# Patient Record
Sex: Female | Born: 2000 | Race: White | Hispanic: Yes | Marital: Single | Smoking: Former smoker
Health system: Southern US, Community
[De-identification: ages and names within clinical notes are randomized; demographics above are authoritative.]

## PROBLEM LIST (undated history)

## (undated) ENCOUNTER — Inpatient Hospital Stay: Payer: Self-pay

## (undated) DIAGNOSIS — R51 Headache: Secondary | ICD-10-CM

## (undated) DIAGNOSIS — D649 Anemia, unspecified: Secondary | ICD-10-CM

## (undated) DIAGNOSIS — G43009 Migraine without aura, not intractable, without status migrainosus: Secondary | ICD-10-CM

## (undated) HISTORY — DX: Headache: R51

## (undated) HISTORY — DX: Anemia, unspecified: D64.9

## (undated) HISTORY — PX: NO PAST SURGERIES: SHX2092

## (undated) HISTORY — DX: Migraine without aura, not intractable, without status migrainosus: G43.009

---
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
## 2018-08-19 ENCOUNTER — Encounter: Payer: Self-pay | Admitting: Obstetrics and Gynecology

## 2018-08-19 NOTE — Progress Notes (Signed)
[place], [employer] Complaint  Patient presents with  . New Patient (Initial Visit)    would like to start Birth control history of abnormal cycle     HPI:      [name] is a 18 y.o. No obstetric history on file. who LMP was Patient's last menstrual period was 08/13/2018., presents today for NP [facility] consult. She is interested in either IUD or nexplanon. Menses are Q1-3 months, lasting 3-7 days, occas BTB, mild dysmen occas. Menses have been this way since menarche age 33. She is sex active, using condoms sometimes. Currently on period.  Has a hx of migraines without aura but does have some vision changes druing headaches. No hx of HTN, DVTs. No Tob/alcholo/drug use.  No hx of STDs.   Past Medical History:  Diagnosis Date  . Headache(784.0)   . Migraine without aura     No past surgical history on file.  Family History  Problem Relation Age of Onset  . Diabetes Paternal Grandfather        Died at the age of 25  . Migraines Mother   . Migraines Other     Social History   Socioeconomic History  . Marital status: Single    Spouse name: Not on file  . Number of children: Not on file  . Years of education: Not on file  . Highest education level: Not on file  Occupational History  . Not on file  Social Needs  . Financial resource strain: Not on file  . Food insecurity    Worry: Not on file    Inability: Not on file  . Transportation needs    Medical: Not on file    Non-medical: Not on file  Tobacco Use  . Smoking status: Never Smoker  . Smokeless tobacco: Never Used  Substance and Sexual Activity  . Alcohol use: No  . Drug use: No  . Sexual activity: Never  Lifestyle  . Physical activity    Days per week: Not on file    Minutes per session: Not on file  . Stress: Not on file  Relationships  . Social Herbalist on phone: Not on file    Gets together: Not on file    Attends religious service: Not on file    Active  member of club or organization: Not on file    Attends meetings of clubs or organizations: Not on file    Relationship status: Not on file  . Intimate partner violence    Fear of current or ex partner: Not on file    Emotionally abused: Not on file    Physically abused: Not on file    Forced sexual activity: Not on file  Other Topics Concern  . Not on file  Social History Narrative   [name] is a 11th grade student.   She attends [employer].   She lives with both parents and her little brother.   She enjoys eating, soccer, and volleyball.    Outpatient Medications Prior to Visit  Medication Sig Dispense Refill  . ondansetron (ZOFRAN ODT) 4 MG disintegrating tablet Take 1 tablet (4 mg total) by mouth every 8 (eight) hours as needed for nausea or vomiting. 10 tablet 0  . sulfamethoxazole-trimethoprim (BACTRIM DS,SEPTRA DS) 800-160 MG tablet Take 1 tablet by mouth 2 (two) times daily. 10 tablet 0  . topiramate (TOPAMAX) 25 MG tablet Take one tablet at nighttime for one  week then 2 tablets at nighttime 62 tablet 5   No facility-administered medications prior to visit.       ROS:  Review of Systems  Constitutional: Negative for fatigue, fever and unexpected weight change.  Respiratory: Negative for cough, shortness of breath and wheezing.   Cardiovascular: Negative for chest pain, palpitations and leg swelling.  Gastrointestinal: Negative for blood in stool, constipation, diarrhea, nausea and vomiting.  Endocrine: Negative for cold intolerance, heat intolerance and polyuria.  Genitourinary: Negative for dyspareunia, dysuria, flank pain, frequency, genital sores, hematuria, menstrual problem, pelvic pain, urgency, vaginal bleeding, vaginal discharge and vaginal pain.  Musculoskeletal: Negative for back pain, joint swelling and myalgias.  Skin: Negative for rash.  Neurological: Positive for headaches. Negative for dizziness, syncope, light-headedness and numbness.   Hematological: Negative for adenopathy.  Psychiatric/Behavioral: Negative for agitation, confusion, sleep disturbance and suicidal ideas. The patient is not nervous/anxious.   BREAST: No symptoms   OBJECTIVE:   Vitals:  BP 110/62   Pulse 88   Ht 5\' 3"  (1.6 m)   Wt 141 lb 12.8 oz (64.3 kg)   LMP 08/13/2018   SpO2 99%   BMI 25.12 kg/m   Physical Exam Vitals signs reviewed.  Constitutional:      Appearance: She is well-developed.  Neck:     Musculoskeletal: Normal range of motion.  Pulmonary:     Effort: Pulmonary effort is normal.  Genitourinary:    General: Normal vulva.     Pubic Area: No rash.      Labia:        Right: No rash, tenderness or lesion.        Left: No rash, tenderness or lesion.      Vagina: Normal. No vaginal discharge, erythema or tenderness.     Cervix: Normal.  Musculoskeletal: Normal range of motion.  Skin:    General: Skin is warm and dry.  Neurological:     General: No focal deficit present.     Mental Status: She is alert and oriented to person, place, and time.  Psychiatric:        Mood and Affect: Mood normal.        Behavior: Behavior normal.        Thought Content: Thought content normal.        Judgment: Judgment normal.    IUD Insertion Procedure Note Patient identified, informed consent performed, consent signed.   Discussed risks of irregular bleeding, cramping, infection, malpositioning or misplacement of the IUD outside the uterus which may require further procedure such as laparoscopy, risk of failure <1%. Time out was performed.   Speculum placed in the vagina.  Cervix visualized.  Cleaned with Betadine x 2.  Grasped anteriorly with a single tooth tenaculum.  Uterus sounded to 8.0 cm.   IUD placed per manufacturer's recommendations.  Strings trimmed to 3 cm. Tenaculum was removed, good hemostasis noted.  Patient tolerated procedure well.   Assessment/Plan: Encounter for insertion of intrauterine contraceptive device (IUD) - Plan:  Levonorgestrel (KYLEENA) 19.5 MG IUD, BC options discussed. Pt decided on IUD. Kyleena inserted today. Pt did well. RTO in 4 wks for f/u. Condoms.  Screening for STD (sexually transmitted disease) - Plan: Cervicovaginal ancillary only,    Meds ordered this encounter  Medications  . Levonorgestrel (KYLEENA) 19.5 MG IUD    Sig: 1 each (19.5 mg total) by Intrauterine route once for 1 dose.    Dispense:  1 Intra Uterine Device    Refill:  0  Order Specific Question:   Supervising Provider    Answer:   Nadara MustardHARRIS, ROBERT P [[id number]]   Patient was given post-procedure instructions.  She was advised to have backup contraception for one week.   Call if you are having increasing pain, cramps or bleeding or if you have a fever greater than 100.4 degrees F., shaking chills, nausea or vomiting. Patient was also asked to check IUD strings periodically and follow up in 4 weeks for IUD check.   Return in about 4 weeks (around 09/17/2018) for IUD f/u.  Atiana Levier B. Kaevion Sinclair, PA-C 08/20/2018 11:10 AM

## 2018-08-19 NOTE — Patient Instructions (Addendum)
I value your feedback and entrusting us with your care. If you get a [facility] patient survey, I would appreciate you taking the time to let us know about your experience today. Thank you!  [facility] [phone]  Instructions after IUD insertion  Most women experience no significant problems after insertion of an IUD, however minor cramping and spotting for a few days is common. Cramps may be treated with ibuprofen 800mg every 8 hours or Tylenol 650 mg every 4 hours. Contact [facility] immediately if you experience any of the following symptoms during the next week: temperature >99.6 degrees, worsening pelvic pain, abdominal pain, fainting, unusually heavy vaginal bleeding, foul vaginal discharge, or if you think you have expelled the IUD.  Nothing inserted in the vagina for 48 hours. You will be scheduled for a follow up visit in approximately four weeks.  You should check monthly to be sure you can feel the IUD strings in the upper vagina. If you are having a monthly period, try to check after each period. If you cannot feel the IUD strings,  contact [facility] immediately so we can do an exam to determine if the IUD has been expelled.   Please use backup protection until we can confirm the IUD is in place.  Call [facility] if you are exposed to or diagnosed with a sexually transmitted infection, as we will need to discuss whether it is safe for you to continue using an IUD.   

## 2018-08-20 ENCOUNTER — Other Ambulatory Visit: Payer: Self-pay

## 2018-08-20 ENCOUNTER — Other Ambulatory Visit (HOSPITAL_COMMUNITY)
Admission: RE | Admit: 2018-08-20 | Discharge: 2018-08-20 | Disposition: A | Discharge status: Home / Self Care | Payer: Medicaid Other | Source: Ambulatory Visit | Attending: Obstetrics and Gynecology | Admitting: Obstetrics and Gynecology

## 2018-08-20 ENCOUNTER — Encounter: Payer: Self-pay | Admitting: Obstetrics and Gynecology

## 2018-08-20 ENCOUNTER — Ambulatory Visit (INDEPENDENT_AMBULATORY_CARE_PROVIDER_SITE_OTHER): Payer: Self-pay | Admitting: Obstetrics and Gynecology

## 2018-08-20 VITALS — BP 110/62 | HR 88 | Ht 63.0 in | Wt 141.8 lb

## 2018-08-20 DIAGNOSIS — Z113 Encounter for screening for infections with a predominantly sexual mode of transmission: Secondary | ICD-10-CM | POA: Insufficient documentation

## 2018-08-20 DIAGNOSIS — Z30430 Encounter for insertion of intrauterine contraceptive device: Secondary | ICD-10-CM

## 2018-08-20 MED ORDER — KYLEENA 19.5 MG IU IUD: 19.5000 mg | INTRAUTERINE_SYSTEM | Freq: Once | INTRAUTERINE | 0 refills | Status: DC

## 2018-08-21 LAB — CERVICOVAGINAL ANCILLARY ONLY
Chlamydia: NEGATIVE
Neisseria Gonorrhea: NEGATIVE

## 2018-09-18 ENCOUNTER — Ambulatory Visit: Payer: Medicaid Other | Admitting: Obstetrics and Gynecology

## 2018-10-06 ENCOUNTER — Ambulatory Visit: Payer: Medicaid Other | Admitting: Obstetrics and Gynecology

## 2018-10-06 NOTE — Progress Notes (Deleted)
   No chief complaint on file.    History of Present Illness:  Chelsea Glenn is a 18 y.o. that had a [place] IUD placed approximately 6 weeks ago. Since that time, she denies dyspareunia, pelvic pain, non-menstrual bleeding, vaginal d/c, heavy bleeding.   Review of Systems  Physical Exam:  There were no vitals taken for this visit. There is no height or weight on file to calculate BMI.  Pelvic exam:  Two IUD strings {DESC; PRESENT/ABSENT:17923::"present"} seen coming from the cervical os. EGBUS, vaginal vault and cervix: within normal limits   Assessment:   No diagnosis found.  IUD strings present in proper location; pt doing well  Plan: F/u if any signs of infection or can no longer feel the strings.   Alicia B. Copland, PA-C 10/06/2018 12:00 PM

## 2019-01-25 NOTE — Progress Notes (Deleted)
   No chief complaint on file.    History of Present Illness:  Chelsea Glenn is a 18 y.o. that had a [place] IUD placed approximately 5 months ago. Since that time, she denies dyspareunia, pelvic pain, non-menstrual bleeding, vaginal d/c, heavy bleeding.   Review of Systems  Physical Exam:  There were no vitals taken for this visit. There is no height or weight on file to calculate BMI.  Pelvic exam:  Two IUD strings {DESC; PRESENT/ABSENT:17923::"present"} seen coming from the cervical os. EGBUS, vaginal vault and cervix: within normal limits   Assessment:   No diagnosis found.  IUD strings present in proper location; pt doing well  Plan: F/u if any signs of infection or can no longer feel the strings.   Alicia B. Copland, PA-C 01/25/2019 4:51 PM

## 2019-01-26 ENCOUNTER — Ambulatory Visit: Payer: Medicaid Other | Admitting: Obstetrics and Gynecology

## 2020-11-16 ENCOUNTER — Encounter: Payer: Self-pay | Admitting: Obstetrics and Gynecology

## 2020-11-16 ENCOUNTER — Ambulatory Visit (INDEPENDENT_AMBULATORY_CARE_PROVIDER_SITE_OTHER): Payer: Medicaid Other | Admitting: Obstetrics and Gynecology

## 2020-11-16 ENCOUNTER — Other Ambulatory Visit: Payer: Self-pay

## 2020-11-16 ENCOUNTER — Other Ambulatory Visit (HOSPITAL_COMMUNITY)
Admission: RE | Admit: 2020-11-16 | Discharge: 2020-11-16 | Disposition: A | Discharge status: Home / Self Care | Payer: Medicaid Other | Source: Ambulatory Visit | Attending: Obstetrics and Gynecology | Admitting: Obstetrics and Gynecology

## 2020-11-16 VITALS — BP 100/60 | Ht 63.0 in | Wt 140.0 lb

## 2020-11-16 DIAGNOSIS — Z113 Encounter for screening for infections with a predominantly sexual mode of transmission: Secondary | ICD-10-CM

## 2020-11-16 DIAGNOSIS — N939 Abnormal uterine and vaginal bleeding, unspecified: Secondary | ICD-10-CM | POA: Diagnosis not present

## 2020-11-16 DIAGNOSIS — Z124 Encounter for screening for malignant neoplasm of cervix: Secondary | ICD-10-CM | POA: Diagnosis present

## 2020-11-16 DIAGNOSIS — Z30431 Encounter for routine checking of intrauterine contraceptive device: Secondary | ICD-10-CM | POA: Diagnosis not present

## 2020-11-16 NOTE — Progress Notes (Signed)
[place], [employer] Complaint  Patient presents with   Menorrhagia    Since IUD insertion and lasting 2-3 weeks, no abnormal pain    HPI:      [name] is a 20 y.o. G2P0020 whose LMP was Patient's last menstrual period was 11/04/2020 (approximate)., presents today for AUB with Kyleena, inserted 08/20/18, for [facility]. Menses had been monthly, lasting 7 days, mod flow, occas BTB, mild dysmen since insertion. Periods are still monthly, but bleeding for 2-3 wks now for the past 8-9 months, with quarter sized clots, BTB, and mild dysmen. Has been under increased stress.  She is sex active, no pain/bleeding. No new partners.  Due for STD testing/pap. Hx of migraine without aura.  Past Medical History:  Diagnosis Date   Headache(784.0)    Migraine without aura     Past Surgical History:  Procedure Laterality Date   NO PAST SURGERIES      Family History  Problem Relation Age of Onset   Diabetes Paternal Grandfather        Died at the age of 52   Migraines Mother    Migraines Other     Social History   Socioeconomic History   Marital status: Single    Spouse name: Not on file   Number of children: Not on file   Years of education: Not on file   Highest education level: Not on file  Occupational History   Not on file  Tobacco Use   Smoking status: Never   Smokeless tobacco: Never  Vaping Use   Vaping Use: Some days  Substance and Sexual Activity   Alcohol use: No   Drug use: No   Sexual activity: Yes    Birth control/protection: I.U.D.    Comment: Kyleena  Other Topics Concern   Not on file  Social History Narrative   Chelsea Glenn is a 11th grade student.   She attends [employer].   She lives with both parents and her little brother.   She enjoys eating, soccer, and volleyball.   Social Determinants of Health   Financial Resource Strain: Not on file  Food Insecurity: Not on file  Transportation Needs: Not on file  Physical  Activity: Not on file  Stress: Not on file  Social Connections: Not on file  Intimate Partner Violence: Not on file    Outpatient Medications Prior to Visit  Medication Sig Dispense Refill   Levonorgestrel (KYLEENA) 19.5 MG IUD 1 each (19.5 mg total) by Intrauterine route once for 1 dose. 1 Intra Uterine Device 0   ondansetron (ZOFRAN ODT) 4 MG disintegrating tablet Take 1 tablet (4 mg total) by mouth every 8 (eight) hours as needed for nausea or vomiting. 10 tablet 0   sulfamethoxazole-trimethoprim (BACTRIM DS,SEPTRA DS) 800-160 MG tablet Take 1 tablet by mouth 2 (two) times daily. 10 tablet 0   topiramate (TOPAMAX) 25 MG tablet Take one tablet at nighttime for one week then 2 tablets at nighttime 62 tablet 5   No facility-administered medications prior to visit.     ROS:  Review of Systems  Constitutional:  Negative for fever.  Gastrointestinal:  Negative for blood in stool, constipation, diarrhea, nausea and vomiting.  Genitourinary:  Positive for menstrual problem. Negative for dyspareunia, dysuria, flank pain, frequency, hematuria, urgency, vaginal bleeding, vaginal discharge and vaginal pain.  Musculoskeletal:  Negative for back pain.  Skin:  Negative for rash.  BREAST: No symptoms   OBJECTIVE:  Vitals:  BP 100/60   Ht 5\' 3"  (1.6 m)   Wt 140 lb (63.5 kg)   LMP 11/04/2020 (Approximate)   BMI 24.80 kg/m   Physical Exam Vitals reviewed.  Constitutional:      Appearance: She is well-developed.  Pulmonary:     Effort: Pulmonary effort is normal.  Genitourinary:    General: Normal vulva.     Pubic Area: No rash.      Labia:        Right: No rash, tenderness or lesion.        Left: No rash, tenderness or lesion.      Vagina: Normal. No vaginal discharge, erythema or tenderness.     Cervix: Normal.     Uterus: Normal. Not enlarged and not tender.      Adnexa: Right adnexa normal and left adnexa normal.       Right: No mass or tenderness.         Left: No mass or  tenderness.       Comments: IUD STRINGS IN CX OS; SCANT RED BLEEDING Musculoskeletal:        General: Normal range of motion.     Cervical back: Normal range of motion.  Skin:    General: Skin is warm and dry.  Neurological:     General: No focal deficit present.     Mental Status: She is alert and oriented to person, place, and time.  Psychiatric:        Mood and Affect: Mood normal.        Behavior: Behavior normal.        Thought Content: Thought content normal.        Judgment: Judgment normal.    Assessment/Plan: Abnormal uterine bleeding (AUB)--long periods with IUD for 8-9 months. IUD strings in cx os. Check pap/STD testing. If neg, will check GYN u/s. If IUD in correct location, can add OCPs for cycle control. If malpositioned, pt may do better with Mirena. Will f/u with mgmt.   Encounter for routine checking of intrauterine contraceptive device (IUD)  Cervical cancer screening - Plan: Cytology - PAP  Screening for STD (sexually transmitted disease) - Plan: Cytology - PAP     Return if symptoms worsen or fail to improve.  Wahneta Derocher B. Karnisha Lefebre, PA-C 11/16/2020 11:29 AM

## 2020-11-21 ENCOUNTER — Telehealth: Payer: Self-pay | Admitting: Obstetrics and Gynecology

## 2020-11-21 DIAGNOSIS — A749 Chlamydial infection, unspecified: Secondary | ICD-10-CM

## 2020-11-21 DIAGNOSIS — A599 Trichomoniasis, unspecified: Secondary | ICD-10-CM

## 2020-11-21 HISTORY — DX: Chlamydial infection, unspecified: A74.9

## 2020-11-21 LAB — CYTOLOGY - PAP
Chlamydia: POSITIVE — AB
Comment: NEGATIVE
Comment: NEGATIVE
Comment: NORMAL
Diagnosis: UNDETERMINED — AB
High risk HPV: NEGATIVE
Neisseria Gonorrhea: NEGATIVE

## 2020-11-21 MED ORDER — AZITHROMYCIN 500 MG PO TABS: 1000.0000 mg | ORAL_TABLET | Freq: Once | ORAL | 0 refills | Status: AC

## 2020-11-21 MED ORDER — METRONIDAZOLE 500 MG PO TABS: ORAL_TABLET | ORAL | 0 refills | Status: DC

## 2020-11-21 NOTE — Telephone Encounter (Signed)
Pt aware of chlamydia and trich on pap. Rx azithro and flagyl (no EtOH) eRxd. Partner needs tx. No sex activity until 1 wk after both completed tx. RTO in 4 wks for TOC.  Will f/u on BTB with IUD at 4 wks. If gone, then due to chlamydia.. If still present, will check GYN u/s for placement. Pt understands.  CMA to notify [facility].

## 2020-11-25 NOTE — Telephone Encounter (Signed)
[facility] notified. 

## 2021-04-17 ENCOUNTER — Ambulatory Visit: Payer: Medicaid Other | Admitting: Obstetrics and Gynecology

## 2021-04-17 NOTE — Progress Notes (Unsigned)
° ° °  Pa, [facility]   No chief complaint on file.   HPI:      [name] is a 21 y.o. G2P0020 whose LMP was No LMP recorded., presents today for ***  Pos chlamhydia 9/22, treated with azithro. ??? TOC  Patient Active Problem List   Diagnosis Date Noted   Chlamydia 11/21/2020   Episodic tension-type headache, not intractable 09/11/2013   Migraine without aura and without status migrainosus, not intractable 09/10/2013    Past Surgical History:  Procedure Laterality Date   NO PAST SURGERIES      Family History  Problem Relation Age of Onset   Diabetes Paternal Grandfather        Died at the age of 71   Migraines Mother    Migraines Other     Social History   Socioeconomic History   Marital status: Single    Spouse name: Not on file   Number of children: Not on file   Years of education: Not on file   Highest education level: Not on file  Occupational History   Not on file  Tobacco Use   Smoking status: Never   Smokeless tobacco: Never  Vaping Use   Vaping Use: Some days  Substance and Sexual Activity   Alcohol use: No   Drug use: No   Sexual activity: Yes    Birth control/protection: I.U.D.    Comment: Kyleena  Other Topics Concern   Not on file  Social History Narrative   Chelsea Glenn is a 11th grade student.   She attends [employer].   She lives with both parents and her little brother.   She enjoys eating, soccer, and volleyball.   Social Determinants of Health   Financial Resource Strain: Not on file  Food Insecurity: Not on file  Transportation Needs: Not on file  Physical Activity: Not on file  Stress: Not on file  Social Connections: Not on file  Intimate Partner Violence: Not on file    Outpatient Medications Prior to Visit  Medication Sig Dispense Refill   Levonorgestrel (KYLEENA) 19.5 MG IUD 1 each (19.5 mg total) by Intrauterine route once for 1 dose. 1 Intra Uterine Device 0   metroNIDAZOLE (FLAGYL) 500 MG  tablet Take 2 tabs BID for 1 day 4 tablet 0   No facility-administered medications prior to visit.      ROS:  Review of Systems BREAST: No symptoms   OBJECTIVE:   Vitals:  There were no vitals taken for this visit.  Physical Exam  Results: No results found for this or any previous visit (from the past 24 hour(s)).   Assessment/Plan: No diagnosis found.    No orders of the defined types were placed in this encounter.     No follow-ups on file.  Jerene Yeager B. Julies Carmickle, PA-C 04/17/2021 11:32 AM

## 2021-09-06 ENCOUNTER — Ambulatory Visit: Payer: Medicaid Other

## 2021-09-20 ENCOUNTER — Ambulatory Visit: Payer: Medicaid Other

## 2021-09-20 ENCOUNTER — Encounter: Payer: Self-pay | Admitting: Nurse Practitioner

## 2021-09-20 ENCOUNTER — Ambulatory Visit (LOCAL_COMMUNITY_HEALTH_CENTER): Payer: Medicaid Other | Admitting: Nurse Practitioner

## 2021-09-20 VITALS — BP 109/72 | Ht 63.0 in | Wt 150.2 lb

## 2021-09-20 DIAGNOSIS — Z113 Encounter for screening for infections with a predominantly sexual mode of transmission: Secondary | ICD-10-CM

## 2021-09-20 DIAGNOSIS — Z3009 Encounter for other general counseling and advice on contraception: Secondary | ICD-10-CM | POA: Diagnosis not present

## 2021-09-20 DIAGNOSIS — Z01419 Encounter for gynecological examination (general) (routine) without abnormal findings: Secondary | ICD-10-CM | POA: Diagnosis not present

## 2021-09-20 DIAGNOSIS — N760 Acute vaginitis: Secondary | ICD-10-CM

## 2021-09-20 LAB — HM HIV SCREENING LAB: HM HIV Screening: NEGATIVE

## 2021-09-20 LAB — HM HEPATITIS C SCREENING LAB: HM Hepatitis Screen: NEGATIVE

## 2021-09-20 LAB — HEPATITIS B SURFACE ANTIGEN: Hepatitis B Surface Ag: NONREACTIVE

## 2021-09-20 MED ORDER — METRONIDAZOLE 500 MG PO TABS: 500.0000 mg | ORAL_TABLET | Freq: Two times a day (BID) | ORAL | 0 refills | Status: AC

## 2021-09-20 NOTE — Progress Notes (Signed)
WET PREP reveals clue cells and amine and was treated for BV per provider verbal order. Pt given AVS. Verbalized understanding of additional labwork pending. Verbalized understanding of medication side effects and precautions. Lethea Killings RN

## 2021-09-20 NOTE — Patient Instructions (Signed)
For prolong bleeding: Take 800 mg PO three times a day for 5 days  Steps to prevent BV:  Wear all-cotton underwear Sleep without underwear Take showers instead of baths Wear loose fitting clothing, especially during warm/hot weather Use a hair dryer on low after bathing to dry the area Avoid scented soaps and body washes Do not douche Stop smoking   Breast Pain: Stop drinking caffeine  Take Vitamin E Po daily if no improvement

## 2021-09-21 LAB — WET PREP FOR TRICH, YEAST, CLUE
Trichomonas Exam: NEGATIVE
Yeast Exam: NEGATIVE

## 2021-09-21 NOTE — Progress Notes (Signed)
[facility] [address] Main Number: [phone]    Family Planning Visit- Initial Visit  Subjective:  Chelsea Glenn is a 21 y.o.  G1P0010   being seen today for an initial annual visit and to discuss reproductive life planning.  The patient is currently using IUD or IUS for pregnancy prevention. Patient reports   does not want a pregnancy in the next year.     report they are looking for a method that provides High efficacy at preventing pregnancy  Patient has the following medical conditions has Migraine without aura and without status migrainosus, not intractable; Episodic tension-type headache, not intractable; and Chlamydia on their problem list.  Chief Complaint  Patient presents with   Annual Exam    Patient reports to clinic today for a physical and STD screening.     Body mass index is 26.61 kg/m. - Patient is eligible for diabetes screening based on BMI and age >7?  not applicable HA1C ordered? not applicable  Patient reports 8  partner/s in last year. Desires STI screening?  Yes  Has patient been screened once for HCV in the past?  No  No results found for: "HCVAB"  Does the patient have current drug use (including MJ), have a partner with drug use, and/or has been incarcerated since last result? No  If yes-- Screen for HCV through [facility]   Does the patient meet criteria for HBV testing? No  Criteria:  -Household, sexual or needle sharing contact with HBV -History of drug use -HIV positive -Those with known Hep C   Health Maintenance Due  Topic Date Due   COVID-19 Vaccine (1) Never done   HIV Screening  Never done   Hepatitis C Screening  Never done   TETANUS/TDAP  03/12/2019    Review of Systems  Constitutional:  Negative for chills, fever, malaise/fatigue and weight loss.       Breast pain Weight gain  HENT:  Negative for congestion, hearing loss and sore throat.   Eyes:   Positive for blurred vision. Negative for double vision and photophobia.  Respiratory:  Negative for shortness of breath.        Sore throat  Cardiovascular:  Negative for chest pain.  Gastrointestinal:  Negative for abdominal pain, blood in stool, constipation, diarrhea, heartburn, nausea and vomiting.  Genitourinary:  Negative for dysuria and frequency.       Vaginal discharge   Musculoskeletal:  Negative for back pain, joint pain and neck pain.  Skin:  Negative for itching and rash.  Neurological:  Negative for dizziness, weakness and headaches.  Endo/Heme/Allergies:  Does not bruise/bleed easily.  Psychiatric/Behavioral:  Negative for depression, substance abuse and suicidal ideas.     The following portions of the patient's history were reviewed and updated as appropriate: allergies, current medications, past family history, past medical history, past social history, past surgical history and problem list. Problem list updated.   See flowsheet for other program required questions.  Objective:   Vitals:   09/20/21 1528  BP: 109/72  Weight: 150 lb 3.2 oz (68.1 kg)  Height: 5\' 3"  (1.6 m)    Physical Exam Constitutional:      Appearance: Normal appearance.  HENT:     Head: Normocephalic. No abrasion, masses or laceration. Hair is normal.     Jaw: No tenderness or swelling.     Right Ear: External ear normal.     Left Ear: External ear normal.  Nose: Nose normal.     Mouth/Throat:     Lips: Pink. No lesions.     Mouth: Mucous membranes are moist. No lacerations or oral lesions.     Dentition: No dental caries.     Tongue: No lesions.     Palate: No mass and lesions.     Pharynx: No pharyngeal swelling, oropharyngeal exudate, posterior oropharyngeal erythema or uvula swelling.     Tonsils: No tonsillar exudate or tonsillar abscesses.  Eyes:     Pupils: Pupils are equal, round, and reactive to light.  Neck:     Thyroid: No thyroid mass, thyromegaly or thyroid  tenderness.  Cardiovascular:     Rate and Rhythm: Normal rate and regular rhythm.  Pulmonary:     Effort: Pulmonary effort is normal.     Breath sounds: Normal breath sounds.  Chest:  Breasts:    Right: Normal. No swelling, mass, nipple discharge, skin change or tenderness.     Left: Normal. No swelling, mass, nipple discharge, skin change or tenderness.  Abdominal:     General: Abdomen is flat. Bowel sounds are normal.     Palpations: Abdomen is soft.     Tenderness: There is no abdominal tenderness. There is no rebound.  Genitourinary:    Pubic Area: No rash or pubic lice.      Labia:        Right: No rash, tenderness or lesion.        Left: No rash, tenderness or lesion.      Vagina: Normal. No vaginal discharge, erythema, tenderness or lesions.     Cervix: No cervical motion tenderness, discharge, lesion or erythema.     Uterus: Normal.      Adnexa:        Right: No tenderness.         Left: No tenderness.       Rectum: Normal.     Comments: Amount Discharge: small  Odor: No pH: less than 4.5 Adheres to vaginal wall: No Color: color of discharge matches the Demarri Elie swab  IUD strings visualized   Musculoskeletal:     Cervical back: Full passive range of motion without pain and normal range of motion.  Lymphadenopathy:     Cervical: No cervical adenopathy.     Right cervical: No superficial, deep or posterior cervical adenopathy.    Left cervical: No superficial, deep or posterior cervical adenopathy.     Upper Body:     Right upper body: No supraclavicular, axillary or epitrochlear adenopathy.     Left upper body: No supraclavicular, axillary or epitrochlear adenopathy.     Lower Body: No right inguinal adenopathy. No left inguinal adenopathy.  Skin:    General: Skin is warm and dry.     Findings: No erythema, laceration, lesion or rash.  Neurological:     Mental Status: She is alert and oriented to person, place, and time.  Psychiatric:        Attention and  Perception: Attention normal.        Mood and Affect: Mood normal.        Speech: Speech normal.        Behavior: Behavior normal. Behavior is cooperative.       Assessment and Plan:  Chelsea Glenn is a 21 y.o. female presenting to the [facility] Department for an initial annual wellness/contraceptive visit  Contraception counseling: Reviewed options based on patient desire and reproductive life plan. Patient is interested in continuing with  IUD or IUS.   Risks, benefits, and typical effectiveness rates were reviewed.  Questions were answered.  Written information was also given to the patient to review.    The patient will follow up in  1 years for surveillance.  The patient was told to call with any further questions, or with any concerns about this method of contraception.  Emphasized use of condoms 100% of the time for STI prevention.  Need for ECP was assessed. ECP not offered due to continuous use of birth control.   1. Screening examination for venereal disease -STD screening. -Patient accepted all screenings including oral, vaginal CT/GC, wet prep and bloodwork for HIV/RPR.  Patient meets criteria for HepB screening? Yes. Ordered? Yes Patient meets criteria for HepC screening? Yes. Ordered? Yes  Treat wet prep per standing order Discussed time line for State Lab results and that patient will be called with positive results and encouraged patient to call if she had not heard in 2 weeks.  Counseled to return or seek care for continued or worsening symptoms Recommended condom use with all sex  Patient is currently using  IUD  to prevent pregnancy.    - HIV/HCV [facility] - Syphilis Serology, [place] [facility] - HBV Antigen/Antibody State Lab - Chlamydia/Gonorrhea [facility] - Chlamydia/Gonorrhea  Lab - WET PREP FOR TRICH, YEAST, CLUE  2. Well woman exam with routine gynecological exam -Normal well woman exam. -CBE today, next due 08/2024 -PAP due  10/2023  3. Family planning counseling -21 year old female in clinic today for a physical and STD screening. -ROS reviewed.  Patient with complaints of blurry vision, breast pain, vaginal discharge, weight gain, and sore throat.  Patient advised to have an updated eye exam.  Patient agrees to STD screening today, will await test results. Patient encouraged to exercise at least 6-8 bottles of water and eat meals high in protein, low in carbs and fat.  Recommended limiting caffeine and taking Vitamin E to improve breast pain.  CBE normal today.  Patient given PCP list for additional follow up and needs.   -Patient desires to continue using and IUD as a birth control method. -PHQ-9= 9, denies thoughts of self harm.  Sadden by the loss of family members.  Patient given Kathreen Cosier, LCSW card.   4. Bacterial vaginosis -Wet mount reviewed, please treat patient for BV.  - metroNIDAZOLE (FLAGYL) 500 MG tablet; Take 1 tablet (500 mg total) by mouth 2 (two) times daily for 7 days.  Dispense: 14 tablet; Refill: 0   Total time spent: 30 minutes   Return in about 1 year (around 09/21/2022) for Annual well-woman exam.    Glenna Fellows, FNP

## 2021-11-09 ENCOUNTER — Ambulatory Visit: Payer: Self-pay | Admitting: Advanced Practice Midwife

## 2021-11-09 ENCOUNTER — Encounter: Payer: Self-pay | Admitting: Advanced Practice Midwife

## 2021-11-09 DIAGNOSIS — A599 Trichomoniasis, unspecified: Secondary | ICD-10-CM

## 2021-11-09 DIAGNOSIS — R87619 Unspecified abnormal cytological findings in specimens from cervix uteri: Secondary | ICD-10-CM | POA: Insufficient documentation

## 2021-11-09 DIAGNOSIS — R87610 Atypical squamous cells of undetermined significance on cytologic smear of cervix (ASC-US): Secondary | ICD-10-CM

## 2021-11-09 DIAGNOSIS — Z62810 Personal history of physical and sexual abuse in childhood: Secondary | ICD-10-CM

## 2021-11-09 DIAGNOSIS — Z113 Encounter for screening for infections with a predominantly sexual mode of transmission: Secondary | ICD-10-CM

## 2021-11-09 HISTORY — DX: Trichomoniasis, unspecified: A59.9

## 2021-11-09 HISTORY — DX: Personal history of physical and sexual abuse in childhood: Z62.810

## 2021-11-09 LAB — HM HEPATITIS C SCREENING LAB: HM Hepatitis Screen: NEGATIVE

## 2021-11-09 LAB — HM HIV SCREENING LAB: HM HIV Screening: NEGATIVE

## 2021-11-09 NOTE — Progress Notes (Signed)
[facility]  STI clinic/screening visit [address] [place] [address] [phone]  Subjective:  Chelsea Glenn is a 21 y.o. female being seen today for an STI screening visit. The patient reports they do have symptoms.  Patient reports that they do not desire a pregnancy in the next year.   They reported they are interested in discussing contraception today.    Patient's last menstrual period was 11/08/2021 (exact date).   Patient has the following medical conditions:   Patient Active Problem List   Diagnosis Date Noted   Abnormal Pap smear of cervix 11/16/20 ASCUS 11/09/2021   Chlamydia 11/21/2020   Episodic tension-type headache, not intractable 09/11/2013   Migraine without aura and without status migrainosus, not intractable 09/10/2013    Chief Complaint  Patient presents with   SEXUALLY TRANSMITTED DISEASE    Screening - patient complaining of cloudy urine, burning when urinating, cramps in lower abdomen, white and clumpy vaginal discharge     HPI  Patient reports "I think I have a UTI" c/o "cloudy urine", dysuria, spotting after sex and partner can feel strings. +BV 09/20/21 with physical. LMP 11/08/21. Last sex 10/26/21 without condom; with current partner x 6 mo; 1 partner in last 3 mo. Last vaped 05/2021. Last cigar 2021. Last ETOH 10/28/21 (1 Margarita) 1x/mo. Last MJ 03/2021. Kyleena inserted 08/20/18 and good for 5 years. Long discussion with pt about birth control options and potential causes of postcoital spotting  Last HIV test per patient/review of record was 09/20/21 Patient reports last pap was 11/16/20 ASCUS, +trich, +chlamydia  Screening for MPX risk: Does the patient have an unexplained rash? No Is the patient MSM? No Does the patient endorse multiple sex partners or anonymous sex partners? No Did the patient have close or sexual contact with a person diagnosed with MPX? No Has the patient traveled outside the [place] where MPX is  endemic? No Is there a high clinical suspicion for MPX-- evidenced by one of the following No  -Unlikely to be chickenpox  -Lymphadenopathy  -Rash that present in same phase of evolution on any given body part See flowsheet for further details and programmatic requirements.   Immunization history:  Immunization History  Administered Date(s) Administered   HPV 9-valent 06/12/2014   Hepatitis A 02/22/2006, 11/10/2007   Hepatitis B 02/11/1999, 04/07/1999, 09/04/1999   Hpv-Unspecified 04/10/2013   MMR 03/12/2000, 05/09/2004   Meningococcal B, OMV 07/25/2016, 04/09/2017   Meningococcal Mcv4o 04/10/2013, 07/20/2015   Pneumococcal Conjugate PCV 7 06/14/1999, 08/28/1999, 03/12/2000   Tdap 03/11/2009   Varicella 03/12/2000, 02/22/2006     The following portions of the patient's history were reviewed and updated as appropriate: allergies, current medications, past medical history, past social history, past surgical history and problem list.  Objective:  There were no vitals filed for this visit.  Physical Exam Not performed Pt desires to return in 1-2 wks when not bleeding for exam  Assessment and Plan:  Chelsea Glenn is a 21 y.o. female presenting to the [facility] for STI screening  1. Atypical squamous cells of undetermined significance on cytologic smear of cervix (ASC-US) Needs repeat pap 10/2021  2. Screening examination for venereal disease Please give pt primary care MD list Immunization nurse consult  - Syphilis Serology, [place] - HIV/HCV [place]     Return in about 2 weeks (around 11/23/2021) for STD exam.  No future appointments.  Elizabeth A Sciora, CNM 

## 2021-12-19 ENCOUNTER — Ambulatory Visit (LOCAL_COMMUNITY_HEALTH_CENTER): Payer: Medicaid Other | Admitting: Nurse Practitioner

## 2021-12-19 VITALS — BP 117/73

## 2021-12-19 DIAGNOSIS — Z113 Encounter for screening for infections with a predominantly sexual mode of transmission: Secondary | ICD-10-CM

## 2021-12-19 DIAGNOSIS — Z3009 Encounter for other general counseling and advice on contraception: Secondary | ICD-10-CM

## 2021-12-19 LAB — WET PREP FOR TRICH, YEAST, CLUE
Trichomonas Exam: NEGATIVE
Yeast Exam: NEGATIVE

## 2021-12-19 LAB — HM HIV SCREENING LAB: HM HIV Screening: NEGATIVE

## 2021-12-19 LAB — HM HEPATITIS C SCREENING LAB: HM Hepatitis Screen: NEGATIVE

## 2021-12-19 NOTE — Progress Notes (Unsigned)
[facility]  Subjective:  Chelsea Glenn is a 21 y.o. being seen today for a repeat PAP and STD screening.  Patient reports she is currently being treated for a UTI.  Patient reports cloudy urine and burning with urination that began about 2 weeks ago, symptoms have resolved.  Currently taking antibiotics and has 2 days left.  Desires screenings for STDs.    Chief Complaint  Patient presents with   Acute Visit    Here for repeat pap smear and STI check.  On medication currently for UTI    HPI   Does the patient have a current or past history of drug use? Yes   No components found for: "HCV"]   Health Maintenance Due  Topic Date Due   COVID-19 Vaccine (1) Never done   TETANUS/TDAP  03/12/2019   INFLUENZA VACCINE  Never done   CHLAMYDIA SCREENING  11/16/2021    Review of Systems  Constitutional:  Negative for chills, fever, malaise/fatigue and weight loss.  HENT:  Negative for congestion, hearing loss and sore throat.   Eyes:  Negative for blurred vision, double vision and photophobia.  Respiratory:  Negative for shortness of breath.   Cardiovascular:  Negative for chest pain.  Gastrointestinal:  Negative for abdominal pain, blood in stool, constipation, diarrhea, heartburn, nausea and vomiting.  Genitourinary:  Negative for dysuria and frequency.  Musculoskeletal:  Negative for back pain, joint pain and neck pain.  Skin:  Negative for itching and rash.  Neurological:  Negative for dizziness, weakness and headaches.  Endo/Heme/Allergies:  Does not bruise/bleed easily.  Psychiatric/Behavioral:  Negative for depression, substance abuse and suicidal ideas.     The following portions of the patient's history were reviewed and updated as appropriate: allergies, current medications, past family history, past medical history, past social history, past surgical history and problem list. Problem list  updated.   See flowsheet for other program required questions.  Objective:   Vitals:   12/19/21 1518  BP: 117/73    Physical Exam Constitutional:      Appearance: Normal appearance.  HENT:     Head: Normocephalic. No abrasion, masses or laceration. Hair is normal.     Right Ear: External ear normal.     Left Ear: External ear normal.     Nose: Nose normal.     Mouth/Throat:     Lips: Pink.     Mouth: Mucous membranes are moist. No oral lesions.     Pharynx: No oropharyngeal exudate or posterior oropharyngeal erythema.     Tonsils: No tonsillar exudate or tonsillar abscesses.  Eyes:     General: Lids are normal.        Right eye: No discharge.        Left eye: No discharge.     Conjunctiva/sclera: Conjunctivae normal.     Right eye: No exudate.    Left eye: No exudate. Cardiovascular:     Rate and Rhythm: Normal rate and regular rhythm.  Pulmonary:     Effort: Pulmonary effort is normal.     Breath sounds: Normal breath sounds.  Abdominal:     General: Abdomen is flat.     Palpations: Abdomen is soft.     Tenderness: There is no abdominal tenderness. There is no rebound.  Genitourinary:    Pubic Area: No rash or pubic lice.      Labia:        Right: No rash,  tenderness, lesion or injury.        Left: No rash, tenderness, lesion or injury.      Vagina: Normal. No vaginal discharge, erythema or lesions.     Cervix: No cervical motion tenderness, discharge, lesion or erythema.     Uterus: Not enlarged and not tender.      Rectum: Normal.     Comments: Amount Discharge: small  Odor: No pH: less than 4.5 Adheres to vaginal wall: No Color: brownish   Musculoskeletal:     Cervical back: Full passive range of motion without pain, normal range of motion and neck supple.  Lymphadenopathy:     Cervical: No cervical adenopathy.     Right cervical: No superficial, deep or posterior cervical adenopathy.    Left cervical: No superficial, deep or posterior cervical  adenopathy.     Upper Body:     Right upper body: No supraclavicular, axillary or epitrochlear adenopathy.     Left upper body: No supraclavicular, axillary or epitrochlear adenopathy.     Lower Body: No right inguinal adenopathy. No left inguinal adenopathy.  Skin:    General: Skin is warm and dry.     Findings: No lesion or rash.  Neurological:     Mental Status: She is alert and oriented to person, place, and time.  Psychiatric:        Attention and Perception: Attention normal.        Mood and Affect: Mood normal.        Speech: Speech normal.        Behavior: Behavior normal. Behavior is cooperative.       Assessment and Plan:  Chelsea Glenn is a 21 y.o. female presenting to the [facility] Department for a Women's Health problem visit  1. Family planning counseling -Last PAP results reviewed.  Next PAP due 10/2023.  2. Screening examination for venereal disease -STD screening today. -Patient accepted all screenings including vaginal CT/GC, wet prep and bloodwork for HIV/RPR.  Patient meets criteria for HepB screening? No. Ordered? No - low risk Patient meets criteria for HepC screening? Yes. Ordered? Yes  Treat wet prep per standing order Discussed time line for State Lab results and that patient will be called with positive results and encouraged patient to call if she had not heard in 2 weeks.  Counseled to return or seek care for continued or worsening symptoms Recommended condom use with all sex  Patient is currently using  Kyleena   to prevent pregnancy.   - HIV/HCV [facility] - Syphilis Serology, [facility] - [place], YEAST, CLUE     Total time spent: 30 minutes   Return in about 9 months (around 09/21/2022) for Annual well-woman exam.    Gregary Cromer, FNP

## 2021-12-19 NOTE — Progress Notes (Unsigned)
Pt here for pap smear and STI screening.  Pap smear not needed today and will need to be repeated in 10/2023.  Wet mount results reviewed with patient.  No treatment needed at this time per standing orders.  Condoms declined.-Forest Becker, RN

## 2022-01-23 ENCOUNTER — Emergency Department
Admission: EM | Admit: 2022-01-23 | Discharge: 2022-01-24 | Disposition: A | Discharge status: Home / Self Care | Payer: Medicaid Other | Attending: Emergency Medicine | Admitting: Emergency Medicine

## 2022-01-23 ENCOUNTER — Other Ambulatory Visit: Payer: Self-pay

## 2022-01-23 ENCOUNTER — Encounter: Payer: Self-pay | Admitting: *Deleted

## 2022-01-23 DIAGNOSIS — J029 Acute pharyngitis, unspecified: Secondary | ICD-10-CM | POA: Insufficient documentation

## 2022-01-23 DIAGNOSIS — J020 Streptococcal pharyngitis: Secondary | ICD-10-CM

## 2022-01-23 DIAGNOSIS — R000 Tachycardia, unspecified: Secondary | ICD-10-CM | POA: Insufficient documentation

## 2022-01-23 DIAGNOSIS — R197 Diarrhea, unspecified: Secondary | ICD-10-CM | POA: Insufficient documentation

## 2022-01-23 MED ORDER — ACETAMINOPHEN 500 MG PO TABS
1000.0000 mg | ORAL_TABLET | Freq: Once | ORAL | Status: AC
  Administered 2022-01-23: 1000 mg via ORAL
  Filled 2022-01-23: qty 2

## 2022-01-23 NOTE — ED Triage Notes (Addendum)
Pt reports she has strep throat.  Dx at urgent care. Pt states she is not eating or drinking for 4 days.  Pt taking pcn for 2 days.  Pt alert.

## 2022-01-24 MED ORDER — DEXAMETHASONE SODIUM PHOSPHATE 10 MG/ML IJ SOLN
10.0000 mg | Freq: Once | INTRAMUSCULAR | Status: AC
  Administered 2022-01-24: 10 mg via INTRAVENOUS
  Filled 2022-01-24: qty 1

## 2022-01-24 MED ORDER — LACTATED RINGERS IV BOLUS
1000.0000 mL | Freq: Once | INTRAVENOUS | Status: AC
  Administered 2022-01-24: 1000 mL via INTRAVENOUS

## 2022-01-24 MED ORDER — KETOROLAC TROMETHAMINE 15 MG/ML IJ SOLN
15.0000 mg | Freq: Once | INTRAMUSCULAR | Status: AC
  Administered 2022-01-24: 15 mg via INTRAVENOUS
  Filled 2022-01-24: qty 1

## 2022-01-24 NOTE — Discharge Instructions (Addendum)
Your exam does not show any abscess in your throat.  Please continue to take the antibiotic. Take tylenol and motrin for pain.
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
## 2022-04-11 ENCOUNTER — Emergency Department
Admission: EM | Admit: 2022-04-11 | Discharge: 2022-04-12 | Disposition: A | Discharge status: Home / Self Care | Payer: Medicaid Other | Attending: Emergency Medicine | Admitting: Emergency Medicine

## 2022-04-11 ENCOUNTER — Encounter: Payer: Self-pay | Admitting: Emergency Medicine

## 2022-04-11 DIAGNOSIS — E876 Hypokalemia: Secondary | ICD-10-CM | POA: Insufficient documentation

## 2022-04-11 DIAGNOSIS — E871 Hypo-osmolality and hyponatremia: Secondary | ICD-10-CM | POA: Insufficient documentation

## 2022-04-11 DIAGNOSIS — J029 Acute pharyngitis, unspecified: Secondary | ICD-10-CM | POA: Insufficient documentation

## 2022-04-11 DIAGNOSIS — N12 Tubulo-interstitial nephritis, not specified as acute or chronic: Secondary | ICD-10-CM | POA: Insufficient documentation

## 2022-04-11 DIAGNOSIS — Z20822 Contact with and (suspected) exposure to covid-19: Secondary | ICD-10-CM | POA: Insufficient documentation

## 2022-04-11 LAB — CBC WITH DIFFERENTIAL/PLATELET
Abs Immature Granulocytes: 0.07 10*3/uL (ref 0.00–0.07)
Basophils Absolute: 0.1 10*3/uL (ref 0.0–0.1)
Basophils Relative: 0 %
Eosinophils Absolute: 0 10*3/uL (ref 0.0–0.5)
Eosinophils Relative: 0 %
HCT: 39.2 % (ref 36.0–46.0)
Hemoglobin: 13 g/dL (ref 12.0–15.0)
Immature Granulocytes: 1 %
Lymphocytes Relative: 8 %
Lymphs Abs: 1.1 10*3/uL (ref 0.7–4.0)
MCH: 27.8 pg (ref 26.0–34.0)
MCHC: 33.2 g/dL (ref 30.0–36.0)
MCV: 83.9 fL (ref 80.0–100.0)
Monocytes Absolute: 1.2 10*3/uL — ABNORMAL HIGH (ref 0.1–1.0)
Monocytes Relative: 9 %
Neutro Abs: 11.3 10*3/uL — ABNORMAL HIGH (ref 1.7–7.7)
Neutrophils Relative %: 82 %
Platelets: 355 10*3/uL (ref 150–400)
RBC: 4.67 MIL/uL (ref 3.87–5.11)
RDW: 15.6 % — ABNORMAL HIGH (ref 11.5–15.5)
WBC: 13.7 10*3/uL — ABNORMAL HIGH (ref 4.0–10.5)
nRBC: 0 % (ref 0.0–0.2)

## 2022-04-11 LAB — RESP PANEL BY RT-PCR (RSV, FLU A&B, COVID)  RVPGX2
Influenza A by PCR: NEGATIVE
Influenza B by PCR: NEGATIVE
Resp Syncytial Virus by PCR: NEGATIVE
SARS Coronavirus 2 by RT PCR: NEGATIVE

## 2022-04-11 LAB — URINALYSIS, ROUTINE W REFLEX MICROSCOPIC
Bilirubin Urine: NEGATIVE
Glucose, UA: NEGATIVE mg/dL
Ketones, ur: 80 mg/dL — AB
Nitrite: NEGATIVE
Protein, ur: 100 mg/dL — AB
Specific Gravity, Urine: 1.026 (ref 1.005–1.030)
pH: 5 (ref 5.0–8.0)

## 2022-04-11 LAB — COMPREHENSIVE METABOLIC PANEL
ALT: 23 U/L (ref 0–44)
AST: 32 U/L (ref 15–41)
Albumin: 3.8 g/dL (ref 3.5–5.0)
Alkaline Phosphatase: 94 U/L (ref 38–126)
Anion gap: 12 (ref 5–15)
BUN: 6 mg/dL (ref 6–20)
CO2: 21 mmol/L — ABNORMAL LOW (ref 22–32)
Calcium: 9.2 mg/dL (ref 8.9–10.3)
Chloride: 99 mmol/L (ref 98–111)
Creatinine, Ser: 0.65 mg/dL (ref 0.44–1.00)
GFR, Estimated: >60 mL/min (ref >60)
Glucose, Bld: 139 mg/dL — ABNORMAL HIGH (ref 70–99)
Potassium: 3.3 mmol/L — ABNORMAL LOW (ref 3.5–5.1)
Sodium: 132 mmol/L — ABNORMAL LOW (ref 135–145)
Total Bilirubin: 0.7 mg/dL (ref 0.3–1.2)
Total Protein: 8.6 g/dL — ABNORMAL HIGH (ref 6.5–8.1)

## 2022-04-11 LAB — POC URINE PREG, ED: Preg Test, Ur: NEGATIVE

## 2022-04-11 LAB — GROUP A STREP BY PCR: Group A Strep by PCR: NOT DETECTED

## 2022-04-11 LAB — LIPASE, BLOOD: Lipase: 30 U/L (ref 11–51)

## 2022-04-11 MED ORDER — SODIUM CHLORIDE 0.9 % IV SOLN
1.0000 g | Freq: Once | INTRAVENOUS | Status: AC
  Administered 2022-04-11: 1 g via INTRAVENOUS
  Filled 2022-04-11: qty 10

## 2022-04-11 MED ORDER — KETOROLAC TROMETHAMINE 30 MG/ML IJ SOLN
30.0000 mg | Freq: Once | INTRAMUSCULAR | Status: AC
  Administered 2022-04-11: 30 mg via INTRAVENOUS
  Filled 2022-04-11: qty 1

## 2022-04-11 MED ORDER — SODIUM CHLORIDE 0.9 % IV BOLUS
1000.0000 mL | Freq: Once | INTRAVENOUS | Status: AC
  Administered 2022-04-11: 1000 mL via INTRAVENOUS

## 2022-04-11 MED ORDER — CEFDINIR 300 MG PO CAPS: 300.0000 mg | ORAL_CAPSULE | Freq: Two times a day (BID) | ORAL | 0 refills | Status: AC

## 2022-04-11 NOTE — ED Provider Notes (Signed)
[facility] Provider Note  Patient Contact: 8:33 PM (approximate)   History   Abdominal Pain   HPI  Chelsea Glenn is a 22 y.o. female with a history of migraines, presents to the emergency department, stating that she has felt ill for the past 2 to 3 days.  Patient has anterior neck discomfort, pharyngitis, headache and some nausea.  She states that she had dysuria on Tuesday and some increased urinary frequency.  She also states that she has had some nasal congestion and cough.  No chest pain, chest tightness or shortness of breath.      Physical Exam   Triage Vital Signs: ED Triage Vitals  Enc Vitals Group     BP 04/11/22 1934 116/75     Pulse Rate 04/11/22 1934 (!) 127     Resp 04/11/22 1934 20     Temp 04/11/22 1934 99.8 F (37.7 C)     Temp Source 04/11/22 1934 Oral     SpO2 04/11/22 1934 98 %     Weight 04/11/22 1934 150 lb (68 kg)     Height 04/11/22 1934 5' 3"$  (1.6 m)     Head Circumference --      Peak Flow --      Pain Score 04/11/22 1936 8     Pain Loc --      Pain Edu? --      Excl. in [place]? --     Most recent vital signs: Vitals:   04/11/22 2340 04/11/22 2348  BP: 96/68 103/63  Pulse: 89   Resp: 19   Temp: 98.5 F (36.9 C)   SpO2: 97%      Constitutional: Alert and oriented. Patient is lying supine. Eyes: Conjunctivae are normal. PERRL. EOMI. Head: Atraumatic. ENT:      Ears: Tympanic membranes are mildly injected with mild effusion bilaterally.       Nose: No congestion/rhinnorhea.      Mouth/Throat: Mucous membranes are moist. Posterior pharynx is mildly erythematous.  Hematological/Lymphatic/Immunilogical: No cervical lymphadenopathy.  Cardiovascular: Normal rate, regular rhythm. Normal S1 and S2.  Good peripheral circulation. Respiratory: Normal respiratory effort without tachypnea or retractions. Lungs CTAB. Good air entry to the bases with no decreased or absent breath sounds. Gastrointestinal: Bowel sounds 4  quadrants. Soft and nontender to palpation. No guarding or rigidity. No palpable masses. No distention. No CVA tenderness. Musculoskeletal: Full range of motion to all extremities. No gross deformities appreciated. Neurologic:  Normal speech and language. No gross focal neurologic deficits are appreciated.  Skin:  Skin is warm, dry and intact. No rash noted. Psychiatric: Mood and affect are normal. Speech and behavior are normal. Patient exhibits appropriate insight and judgement.   ED Results / Procedures / Treatments   Labs (all labs ordered are listed, but only abnormal results are displayed) Labs Reviewed  CBC WITH DIFFERENTIAL/PLATELET - Abnormal; Notable for the following components:      Result Value   WBC 13.7 (*)    RDW 15.6 (*)    Neutro Abs 11.3 (*)    Monocytes Absolute 1.2 (*)    All other components within normal limits  COMPREHENSIVE METABOLIC PANEL - Abnormal; Notable for the following components:   Sodium 132 (*)    Potassium 3.3 (*)    CO2 21 (*)    Glucose, Bld 139 (*)    Total Protein 8.6 (*)    All other components within normal limits  URINALYSIS, ROUTINE W REFLEX MICROSCOPIC - Abnormal;  Notable for the following components:   Color, Urine AMBER (*)    APPearance HAZY (*)    Hgb urine dipstick LARGE (*)    Ketones, ur 80 (*)    Protein, ur 100 (*)    Leukocytes,Ua TRACE (*)    Bacteria, UA FEW (*)    All other components within normal limits  RESP PANEL BY RT-PCR (RSV, FLU A&B, COVID)  RVPGX2  GROUP A STREP BY PCR  URINE CULTURE  LIPASE, BLOOD  POC URINE PREG, ED       PROCEDURES:  Critical Care performed: No  Procedures   MEDICATIONS ORDERED IN ED: Medications  sodium chloride 0.9 % bolus 1,000 mL (0 mLs Intravenous Stopped 04/11/22 2200)  cefTRIAXone (ROCEPHIN) 1 g in sodium chloride 0.9 % 100 mL IVPB (0 g Intravenous Stopped 04/11/22 2109)  sodium chloride 0.9 % bolus 1,000 mL (1,000 mLs Intravenous New Bag/Given 04/11/22 2223)  ketorolac  (TORADOL) 30 MG/ML injection 30 mg (30 mg Intravenous Given 04/11/22 2226)     IMPRESSION / MDM / ASSESSMENT AND PLAN / ED COURSE  I reviewed the triage vital signs and the nursing notes.                              Assessment and plan:  Pyelonephritis Unspecified viral infection. 22 year old female presents to the emergency department with multiple medical complaints.  Patient endorses both viral URI like symptoms as well as dysuria and some increased urinary frequency.  Patient had low-grade fever and tachycardia at triage.  Vital signs otherwise reassuring.  On exam, patient was alert and nontoxic-appearing with no increased work of breathing.  Abdomen was soft and nontender.  Differential diagnosis included UTI, pyelonephritis, sepsis, COVID-19, influenza...  Urinalysis suggestive of UTI with large amount of hemoglobin and 21-50 white blood cells.  White blood cell count elevated at 13.7.  Patient had mild hyponatremia and hypokalemia on CMP.  Lipase within range.  COVID, flu and RSV was negative.  Patient tested negative for group A strep.  Urine pregnancy test was negative.  Patient was given IV Rocephin and 2 L of normal saline while in the emergency department and overall felt improved.  Vital signs were reassuring prior to discharge.  Will treat patient with Omnicef twice daily for the next 7 days to cover her for pyelonephritis.  Recommended rest and hydration at home.  Return precautions were given to return with new or worsening symptoms.  Clinical Course as of 04/11/22 2359  Wed Apr 11, 2022  2033 Specific Gravity, Urine: 1.026 [JW]    Clinical Course User Index [JW] Lannie Fields, PA-C     FINAL CLINICAL IMPRESSION(S) / ED DIAGNOSES   Final diagnoses:  Pyelonephritis     Rx / DC Orders   ED Discharge Orders          Ordered    cefdinir (OMNICEF) 300 MG capsule  2 times daily        04/11/22 2354             Note:  This document was prepared  using Dragon voice recognition software and may include unintentional dictation errors.   Vallarie Mare Randall, PA-C 04/12/22 0000    Duffy Bruce, MD 04/12/22 [phone]

## 2022-04-11 NOTE — Discharge Instructions (Addendum)
Take Omnicef twice daily for seven days.

## 2022-04-12 NOTE — ED Notes (Signed)
Discharge instructions explained to patient and family at this time. Patient and family state they understand and agree.

## 2022-04-13 ENCOUNTER — Encounter: Payer: Self-pay | Admitting: Family

## 2022-04-13 ENCOUNTER — Ambulatory Visit (LOCAL_COMMUNITY_HEALTH_CENTER): Payer: Medicaid Other | Admitting: Family

## 2022-04-13 VITALS — BP 126/82 | Ht 63.0 in | Wt 143.6 lb

## 2022-04-13 DIAGNOSIS — Z30011 Encounter for initial prescription of contraceptive pills: Secondary | ICD-10-CM

## 2022-04-13 DIAGNOSIS — Z309 Encounter for contraceptive management, unspecified: Secondary | ICD-10-CM | POA: Diagnosis not present

## 2022-04-13 DIAGNOSIS — Z30432 Encounter for removal of intrauterine contraceptive device: Secondary | ICD-10-CM

## 2022-04-13 MED ORDER — NORGESTIM-ETH ESTRAD TRIPHASIC 0.18/0.215/0.25 MG-25 MCG PO TABS: 1.0000 | ORAL_TABLET | Freq: Every day | ORAL | 5 refills | Status: DC

## 2022-04-13 NOTE — Progress Notes (Signed)
Pt is here for IUD removal.   The patient was dispensed Tri Sprintec #6 today. I provided counseling today regarding the medication. We discussed the medication, the side effects and when to call clinic. Patient given the opportunity to ask questions. Questions answered.  Pt will make an appointment for STD testing after 05/01/2022. Windle Guard, RN

## 2022-04-13 NOTE — Progress Notes (Signed)
[place] problem visit  [place] Department  Subjective:  Chelsea Glenn is a 22 y.o. being seen today for IUD removal, Kyleena inserted 06/20/2018 at [facility]. Wants to start OCs, has never taken pills before. Last PE 09/20/2021, last IC 03/27/2022. Would like STD testing, but currently on Omnicef for pyelonephritis prescribed at the ED on 04/11/2022.  Chief Complaint  Patient presents with   Contraception    IUD removal and STD screening    HPI See Subjective  Does the patient have a current or past history of drug use? No   No components found for: "HCV"]   Health Maintenance Due  Topic Date Due   COVID-19 Vaccine (1) Never done   DTaP/Tdap/Td (2 - Td or Tdap) 03/12/2019   INFLUENZA VACCINE  Never done   CHLAMYDIA SCREENING  11/16/2021    Review of Systems  All other systems reviewed and are negative.   The following portions of the patient's history were reviewed and updated as appropriate: allergies, current medications, past family history, past medical history, past social history, past surgical history and problem list. Problem list updated.   See flowsheet for other program required questions.  Objective:   Vitals:   04/13/22 1305  BP: 126/82  Weight: 143 lb 9.6 oz (65.1 kg)  Height: 5' 3"$  (1.6 m)    Physical Exam Genitourinary:    General: Normal vulva.     Exam position: Lithotomy position.     Vagina: Normal.     Cervix: Normal.     Rectum: Normal.     Comments: Kyleena IUD strings visible from cervical os Lymphadenopathy:     Lower Body: No right inguinal adenopathy. No left inguinal adenopathy.    Assessment and Plan:  Chelsea Glenn is a 22 y.o. female presenting to the [facility] Department for a Women's Health problem visit  1. Encounter for removal of intrauterine contraceptive device (IUD) Kyleena IUD removed without complications, patient tolerated procedure Advised to take OTC  Ibuprofen for cramping and bleeding  2. Encounter for initial prescription of contraceptive pills - Norgestimate-Ethinyl Estradiol Triphasic 0.18/0.215/0.25 MG-25 MCG tab; Take 1 tablet by mouth daily.  Dispense: 28 tablet; Refill: 5  Start pills today, use condoms for next 7 days as backup contraception Given verbal and written information on oral contraceptives.  Return to clinic for STD testing 2 weeks after last dose of Omnicef, on or after March 6th.  Return in about 6 months (around 10/12/2022) for Yearly physical.and continuation of OCs.  Future Appointments  Date Time Provider [place]  05/02/2022  4:00 PM [facility] PROVIDER [facility] None    Marline Backbone, FNP

## 2022-04-14 LAB — URINE CULTURE: Culture: >=100000 — AB

## 2022-05-02 ENCOUNTER — Ambulatory Visit: Payer: Managed Care, Other (non HMO) | Admitting: Advanced Practice Midwife

## 2022-05-02 DIAGNOSIS — Z113 Encounter for screening for infections with a predominantly sexual mode of transmission: Secondary | ICD-10-CM

## 2022-05-02 DIAGNOSIS — N12 Tubulo-interstitial nephritis, not specified as acute or chronic: Secondary | ICD-10-CM | POA: Insufficient documentation

## 2022-05-02 LAB — WET PREP FOR TRICH, YEAST, CLUE
Trichomonas Exam: NEGATIVE
Yeast Exam: NEGATIVE

## 2022-05-02 LAB — HM HIV SCREENING LAB: HM HIV Screening: NEGATIVE

## 2022-05-02 LAB — HM HEPATITIS C SCREENING LAB: HM Hepatitis Screen: NEGATIVE

## 2022-05-02 MED ORDER — METRONIDAZOLE 500 MG PO TABS: 500.0000 mg | ORAL_TABLET | Freq: Two times a day (BID) | ORAL | 0 refills | Status: DC

## 2022-05-02 NOTE — Progress Notes (Signed)
The patient was dispensed Metronidazole today. I provided counseling today regarding the medication. We discussed the medication, the side effects and when to call clinic. Patient given the opportunity to ask questions. Questions answered. Condoms given. Wet Prep reviewed by provider during visit. Patient states she will make an appointment at [place] in April for annual exam and PAP. BThiele RN
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
## 2022-05-06 LAB — GONOCOCCUS CULTURE

## 2022-05-31 ENCOUNTER — Ambulatory Visit: Payer: Managed Care, Other (non HMO)

## 2022-06-21 ENCOUNTER — Ambulatory Visit: Payer: Self-pay | Admitting: Family

## 2022-06-21 ENCOUNTER — Encounter: Payer: Self-pay | Admitting: Family

## 2022-06-21 VITALS — BP 120/68 | HR 86 | Ht 63.0 in | Wt 147.0 lb

## 2022-06-21 DIAGNOSIS — J020 Streptococcal pharyngitis: Secondary | ICD-10-CM

## 2022-06-21 DIAGNOSIS — G43909 Migraine, unspecified, not intractable, without status migrainosus: Secondary | ICD-10-CM | POA: Insufficient documentation

## 2022-06-21 DIAGNOSIS — J029 Acute pharyngitis, unspecified: Secondary | ICD-10-CM

## 2022-06-22 ENCOUNTER — Telehealth: Payer: Self-pay

## 2022-06-22 MED ORDER — PENICILLIN V POTASSIUM 500 MG PO TABS: 500.0000 mg | ORAL_TABLET | Freq: Four times a day (QID) | ORAL | 0 refills | Status: DC

## 2022-06-24 LAB — POCT RAPID STREP A (OFFICE): Rapid Strep A Screen: NEGATIVE

## 2022-06-24 NOTE — Patient Instructions (Signed)

## 2022-06-24 NOTE — Progress Notes (Signed)
   Subjective   Chelsea Glenn is a 22 y.o. female presenting with a sore throat for 3 days.  Associated symptoms include:  none.  Symptoms are progressively worsening.  Home treatment thus far includes:  rest, hydration, NSAIDS/acetaminophen, lozenges, and OTC sore throat / cold products.  Known sick contacts with similar symptoms.  There is a previous history of of similar symptoms.  Review of Systems  HENT:  Positive for sore throat.   All other systems reviewed and are negative.    Objective   Exam:  BP 120/68   Pulse 86   Ht 5\' 3"  (1.6 m)   Wt 147 lb (66.7 kg)   SpO2 98%   BMI 26.04 kg/m  Physical Exam Vitals and nursing note reviewed.  Constitutional:      Appearance: Normal appearance. She is normal weight.  HENT:     Head: Normocephalic.     Mouth/Throat:     Pharynx: Pharyngeal swelling, oropharyngeal exudate and posterior oropharyngeal erythema present.     Tonsils: Tonsillar exudate present.  Eyes:     Pupils: Pupils are equal, round, and reactive to light.  Cardiovascular:     Rate and Rhythm: Normal rate.  Pulmonary:     Effort: Pulmonary effort is normal.  Neurological:     General: No focal deficit present.     Mental Status: She is alert and oriented to person, place, and time.  Psychiatric:        Mood and Affect: Mood normal.        Behavior: Behavior normal.      Assessment & Plan  Problem List Items Addressed This Visit   None Visit Diagnoses     Pharyngitis due to Streptococcus species    -  Primary   Sending antibiotics for patient.  She will let me know if this does not improve.   Relevant Orders   POCT rapid strep A        Total time spent: 20 minutes  Return as needed or if failing to improve.Miki Kins, FNP  06/21/2022

## 2022-06-25 NOTE — Telephone Encounter (Signed)
Rx was sent for pt

## 2022-09-21 ENCOUNTER — Ambulatory Visit: Payer: Managed Care, Other (non HMO) | Admitting: Advanced Practice Midwife

## 2022-09-21 ENCOUNTER — Encounter: Payer: Self-pay | Admitting: Advanced Practice Midwife

## 2022-09-21 DIAGNOSIS — F17200 Nicotine dependence, unspecified, uncomplicated: Secondary | ICD-10-CM

## 2022-09-21 DIAGNOSIS — Z87891 Personal history of nicotine dependence: Secondary | ICD-10-CM | POA: Insufficient documentation

## 2022-09-21 DIAGNOSIS — R87610 Atypical squamous cells of undetermined significance on cytologic smear of cervix (ASC-US): Secondary | ICD-10-CM

## 2022-09-21 DIAGNOSIS — Z113 Encounter for screening for infections with a predominantly sexual mode of transmission: Secondary | ICD-10-CM

## 2022-09-21 LAB — WET PREP FOR TRICH, YEAST, CLUE
Trichomonas Exam: NEGATIVE
Yeast Exam: NEGATIVE

## 2022-09-21 LAB — HM HEPATITIS C SCREENING LAB: HM Hepatitis Screen: NEGATIVE

## 2022-09-21 LAB — HM HIV SCREENING LAB: HM HIV Screening: NEGATIVE

## 2022-09-21 MED ORDER — CLOTRIMAZOLE 1 % VA CREA: 1.0000 | TOPICAL_CREAM | Freq: Every day | VAGINAL | Status: AC

## 2022-09-21 MED ORDER — METRONIDAZOLE 500 MG PO TABS: 500.0000 mg | ORAL_TABLET | Freq: Two times a day (BID) | ORAL | Status: AC

## 2022-09-21 NOTE — Progress Notes (Signed)
Here for STD screening. Accepts bloodwork. Carol Wood, RN  
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
## 2022-10-26 ENCOUNTER — Other Ambulatory Visit: Payer: Self-pay | Admitting: Family

## 2022-10-26 ENCOUNTER — Telehealth: Payer: Self-pay

## 2022-10-26 ENCOUNTER — Ambulatory Visit (INDEPENDENT_AMBULATORY_CARE_PROVIDER_SITE_OTHER): Payer: Self-pay | Admitting: Family

## 2022-10-26 DIAGNOSIS — R8290 Unspecified abnormal findings in urine: Secondary | ICD-10-CM

## 2022-10-26 LAB — POCT URINALYSIS DIPSTICK
Bilirubin, UA: NEGATIVE
Glucose, UA: NEGATIVE
Ketones, UA: NEGATIVE
Leukocytes, UA: NEGATIVE
Nitrite, UA: NEGATIVE
Protein, UA: NEGATIVE
Spec Grav, UA: 1.015 (ref 1.010–1.025)
Urobilinogen, UA: 0.2 E.U./dL
pH, UA: 6 (ref 5.0–8.0)

## 2022-10-26 NOTE — Progress Notes (Signed)
   CHIEF COMPLAINT  UA/ only visit fot UTI     REASON FOR VISIT  Possible UTI, UA Visit Only      ASSESSMENT & PLAN Diagnoses and all orders for this visit:  Abnormal urine -     POCT Urinalysis Dipstick (78295)     Patient notified.  Total time spent: 20 minutes  Miki Kins, FNP 10/26/2022

## 2022-10-26 NOTE — Telephone Encounter (Signed)
Please let pt know that it doesn't look like she's has a UTI but she's going to send it for a culture anyway since there was some blood in it

## 2022-10-27 LAB — URINALYSIS, ROUTINE W REFLEX MICROSCOPIC
Bilirubin, UA: NEGATIVE
Glucose, UA: NEGATIVE
Ketones, UA: NEGATIVE
Leukocytes,UA: NEGATIVE
Nitrite, UA: NEGATIVE
Protein,UA: NEGATIVE
RBC, UA: NEGATIVE
Specific Gravity, UA: 1.016 (ref 1.005–1.030)
Urobilinogen, Ur: 0.2 mg/dL (ref 0.2–1.0)
pH, UA: 6.5 (ref 5.0–7.5)

## 2022-10-29 LAB — URINE CULTURE: Organism ID, Bacteria: NO GROWTH

## 2022-10-30 ENCOUNTER — Encounter: Payer: Self-pay | Admitting: Family

## 2022-10-30 ENCOUNTER — Telehealth: Payer: Self-pay

## 2022-10-30 NOTE — Telephone Encounter (Signed)
[facility] please review over open encounter about possible UTI.

## 2022-10-30 NOTE — Telephone Encounter (Signed)
Patient Chelsea Glenn asking for call back about her UA and need work letter

## 2022-12-13 ENCOUNTER — Encounter: Payer: Self-pay | Admitting: Family

## 2022-12-13 ENCOUNTER — Ambulatory Visit: Payer: Self-pay | Admitting: Family

## 2022-12-13 VITALS — BP 112/78 | HR 76 | Ht 63.0 in | Wt 155.8 lb

## 2022-12-13 DIAGNOSIS — Z202 Contact with and (suspected) exposure to infections with a predominantly sexual mode of transmission: Secondary | ICD-10-CM
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
## 2022-12-17 LAB — SPECIMEN STATUS REPORT

## 2022-12-17 LAB — IGP,CTNGTV,RFX APTIMA HPV ASCU
Chlamydia, Nuc. Acid Amp: NEGATIVE
Gonococcus, Nuc. Acid Amp: NEGATIVE
PAP Smear Comment: 0
Trich vag by NAA: NEGATIVE

## 2022-12-25 ENCOUNTER — Ambulatory Visit: Payer: Self-pay | Admitting: Family

## 2022-12-25 DIAGNOSIS — N760 Acute vaginitis: Secondary | ICD-10-CM

## 2022-12-27 LAB — NUSWAB VAGINITIS PLUS (VG+)
Candida albicans, NAA: NEGATIVE
Candida glabrata, NAA: NEGATIVE
Chlamydia trachomatis, NAA: NEGATIVE
Neisseria gonorrhoeae, NAA: NEGATIVE
Trich vag by NAA: NEGATIVE

## 2023-01-23 ENCOUNTER — Other Ambulatory Visit: Payer: Self-pay

## 2023-01-23 ENCOUNTER — Emergency Department
Admission: EM | Admit: 2023-01-23 | Discharge: 2023-01-23 | Disposition: A | Discharge status: Home / Self Care | Payer: Medicaid Other | Attending: Emergency Medicine | Admitting: Emergency Medicine

## 2023-01-23 DIAGNOSIS — Z3A01 Less than 8 weeks gestation of pregnancy: Secondary | ICD-10-CM | POA: Insufficient documentation

## 2023-01-23 DIAGNOSIS — R300 Dysuria: Secondary | ICD-10-CM | POA: Insufficient documentation

## 2023-01-23 DIAGNOSIS — J069 Acute upper respiratory infection, unspecified: Secondary | ICD-10-CM | POA: Diagnosis not present

## 2023-01-23 DIAGNOSIS — Z20822 Contact with and (suspected) exposure to covid-19: Secondary | ICD-10-CM | POA: Diagnosis not present

## 2023-01-23 DIAGNOSIS — O26891 Other specified pregnancy related conditions, first trimester: Secondary | ICD-10-CM | POA: Diagnosis present

## 2023-01-23 LAB — URINALYSIS, ROUTINE W REFLEX MICROSCOPIC
Bilirubin Urine: NEGATIVE
Glucose, UA: NEGATIVE mg/dL
Ketones, ur: NEGATIVE mg/dL
Nitrite: NEGATIVE
Protein, ur: NEGATIVE mg/dL
Specific Gravity, Urine: 1.013 (ref 1.005–1.030)
pH: 6 (ref 5.0–8.0)

## 2023-01-23 LAB — RESP PANEL BY RT-PCR (RSV, FLU A&B, COVID)  RVPGX2
Influenza A by PCR: NEGATIVE
Influenza B by PCR: NEGATIVE
Resp Syncytial Virus by PCR: NEGATIVE
SARS Coronavirus 2 by RT PCR: NEGATIVE

## 2023-01-23 LAB — GROUP A STREP BY PCR: Group A Strep by PCR: NOT DETECTED

## 2023-01-23 LAB — POC URINE PREG, ED: Preg Test, Ur: POSITIVE — AB

## 2023-01-23 NOTE — ED Triage Notes (Addendum)
Pt reports cough. Congestion, headache and nausea x 4 days. Pt states she also is 4 days late for her menstrual cycle.

## 2023-01-23 NOTE — ED Provider Notes (Signed)
[facility] Provider Note    Event Date/Time   First MD Initiated Contact with Patient 01/23/23 [phone]     (approximate)   History   Cough   HPI  Chelsea Glenn is a 22 y.o. female otherwise healthy who comes in with 2 concerns.  Patient reports that her last period was the second the last week of October she reports that she is about 4 days late for her menstrual cycle.  She reports taking a pregnancy test at home and seen a faint positive second line and wondering if she could be pregnant.  She reports that she had an IUD previously but she had this removed a few months ago.  She denies any concerns for STDs, denies any symptoms to suggest this.  Denies any severe pain on one side, vaginal bleeding.  Patient also reports some coughing, congestion, sore throat, headache, nausea.  She reports that she was told that pneumonia is going around and wanted to be evaluated for pneumonia.  She also reports a history of strep throat and wanted make sure she does not have strep.  She did not take anything for her symptoms this morning.  She reports that she is otherwise healthy does not take any daily medications.  Denies any history of blood clots.  Physical Exam   Triage Vital Signs: ED Triage Vitals  Encounter Vitals Group     BP 01/23/23 0612 123/85     Systolic BP Percentile --      Diastolic BP Percentile --      Pulse Rate 01/23/23 0612 (!) 101     Resp 01/23/23 0612 18     Temp 01/23/23 0612 98.4 F (36.9 C)     Temp Source 01/23/23 0612 Oral     SpO2 01/23/23 0612 99 %     Weight 01/23/23 0611 155 lb (70.3 kg)     Height 01/23/23 0611 5\' 3"  (1.6 m)     Head Circumference --      Peak Flow --      Pain Score 01/23/23 0611 5     Pain Loc --      Pain Education --      Exclude from Growth Chart --     Most recent vital signs: Vitals:   01/23/23 0612  BP: 123/85  Pulse: (!) 101  Resp: 18  Temp: 98.4 F (36.9 C)  SpO2: 99%      General: Awake, no distress.  CV:  Good peripheral perfusion.  Resp:  Normal effort.  Abd:  No distention.  Soft nontender Other:  Clear lungs.  No swelling in legs.  No calf tenderness Supple neck Oropharynx appears clear.  Uvula midline.  No exudates noted on the tonsils.   ED Results / Procedures / Treatments   Labs (all labs ordered are listed, but only abnormal results are displayed) Labs Reviewed  POC URINE PREG, ED - Abnormal; Notable for the following components:      Result Value   Preg Test, Ur Positive (*)    All other components within normal limits  RESP PANEL BY RT-PCR (RSV, FLU A&B, COVID)  [id number]     EKG  My interpretation of EKG:  Normal sinus rate of 89 without any ST elevation or T wave inversions, normal intervals  RADIOLOGY Patient declined x-ray   PROCEDURES:  Critical Care performed: No  Procedures   MEDICATIONS ORDERED IN ED: Medications - No data to display   IMPRESSION /  MDM / ASSESSMENT AND PLAN / ED COURSE  I reviewed the triage vital signs and the nursing notes.   Patient's presentation is most consistent with acute, uncomplicated illness.   Patient comes in with concerns for pregnancy.  She does have a pregnancy test here that was positive.  We discussed prenatal care including getting prenatal vitamins with folic acid, avoiding ibuprofen, following up with an OB/GYN for further screening.  Patient expressed understanding and states that she has an OB/GYN that she can call.  We discussed return precautions in regards to ectopic pregnancy but at this time her abdomen is soft and nontender and she has no vaginal bleeding to suggest ectopic pregnancy.  We discussed screenings for STDs but patient denies any symptoms and will follow this up in the [facility] office.  For her other symptoms patient is very well-appearing without any fevers.  Initially slightly tachycardic in triage but I suspect this is related to some nausea and she reports  decreased p.o. intake.  Repeat heart rate on EKG was 89.  No T wave inversions, no shortness of breath to suggest PE.  No evidence of DVT on examination.  This sounds more likely a viral illness.  We discussed chest x-ray to evaluate for pneumonia but given she is pregnant patient would like to decline and hold off at this time.  Her strep test is pending and we will call her if it is positive.  8:03 AM reevaluated patient.  Abdomen remains soft and nontender.  Patient updated on UA results.  Difficult to interpret given significant squamous cells but given less white cells I suspect this is most likely contamination urine culture is still pending she denies any urinary symptoms.  We discussed return precautions in regards to this but at this time we will hold off on antibiotics.  Her COVID and, flu test were negative as well.  Patient at this time feels comfortable with discharge home and return if she develops worsening symptoms      FINAL CLINICAL IMPRESSION(S) / ED DIAGNOSES   Final diagnoses:  Viral URI with cough  Less than [redacted] weeks gestation of pregnancy     Rx / DC Orders   ED Discharge Orders     None        Note:  This document was prepared using Dragon voice recognition software and may include unintentional dictation errors.   Concha Se, MD 01/23/23 [phone]

## 2023-01-23 NOTE — Discharge Instructions (Addendum)
Please call OB/GYN to make a follow-up appointment to discuss your pregnancy.  Avoid ibuprofen.  You can take Tylenol 1 g every 8 hours to help with any pain or discomfort for next week.   For your nausea this is most likely related to your pregnancy.  You take vitamin B6 10 - 25 mg every 6-8 hours and if this is not working you can add doxylamine 12.5 mg every 6-8 hours.  You can get these over-the-counter  Make sure you get a prenatal vitamin that has folic acid in it.  Stay well-hydrated with Body Armor, Pedialyte.  We discussed x-ray but given you had no fever and your oxygen levels were normal we have opted to hold off however if you do continue to feel worse or not getting better and you are willing to do the x-ray can always return to the ER for repeat evaluation.  You can also return if you develop any vaginal bleeding, pain on one side of your abdomen, shortness of breath or any concerns

## 2023-01-24 LAB — URINE CULTURE: Culture: NO GROWTH

## 2023-02-27 NOTE — L&D Delivery Note (Signed)
 Delivery Note   Chelsea Glenn is a 23 y.o. G2P0010 at [redacted]w[redacted]d Estimated Date of Delivery: 09/23/23  PRE-OPERATIVE DIAGNOSIS:  1) [redacted]w[redacted]d pregnancy.  2)A2GDM  POST-OPERATIVE DIAGNOSIS:  1) [redacted]w[redacted]d pregnancy s/p Vaginal, Spontaneous   Delivery Type: Vaginal, Spontaneous   Delivery Anesthesia: Epidural  Labor Complications:  shoulder dystocia    ESTIMATED BLOOD LOSS: 400 ml    FINDINGS:   1) female infant, Apgar scores of 7   at 1 minute and 9   at 5 minutes and a birthweight of pending   SPECIMENS:   PLACENTA:   Appearance: Intact   Removal: Spontaneous     Disposition:  discarded   CORD BLOOD: Collected  DISPOSITION:  Infant left in stable condition in the delivery room, with L&D personnel and mother,  NARRATIVE SUMMARY: Labor course:  Chelsea Glenn is a G2P0010 at [redacted]w[redacted]d who presented to Labor & Delivery for induction of labor. Her initial cervical exam was 2cm. Labor proceeded spontaneously and she was found to be completely dilated at 0007 AM. With excellent maternal pushing effort, she birthed a viable  infant at 0149 AM. There was not a nuchal cord. The head was birthed in LOA and  shoulders did not follow with gentle traction. Patient was laid flat and placed in [place]. The anterior shoulder then delivered followed by the posterior shoulder.  The infant was placed skin-to-skin with mom. The cord was doubly clamped and cut when pulsations ceased. The placenta delivered spontaneously and was noted to be intact with a 3VC. A perineal and vaginal examination was performed. Episiotomy/Lacerations: Periurethral, not repaired  Mother and baby were left in stable condition.  Damien PARSLEY, CNM  09/15/2023 2:15 AM

## 2023-03-11 ENCOUNTER — Ambulatory Visit: Payer: Managed Care, Other (non HMO) | Admitting: Family Medicine

## 2023-03-11 ENCOUNTER — Encounter: Payer: Self-pay | Admitting: Family Medicine

## 2023-03-11 VITALS — BP 112/71 | HR 96 | Temp 97.2°F | Wt 150.6 lb

## 2023-03-11 DIAGNOSIS — Z3481 Encounter for supervision of other normal pregnancy, first trimester: Secondary | ICD-10-CM | POA: Diagnosis not present

## 2023-03-11 DIAGNOSIS — R87610 Atypical squamous cells of undetermined significance on cytologic smear of cervix (ASC-US): Secondary | ICD-10-CM

## 2023-03-11 DIAGNOSIS — Z3480 Encounter for supervision of other normal pregnancy, unspecified trimester: Secondary | ICD-10-CM | POA: Insufficient documentation

## 2023-03-11 DIAGNOSIS — F1291 Cannabis use, unspecified, in remission: Secondary | ICD-10-CM | POA: Insufficient documentation

## 2023-03-11 DIAGNOSIS — G43009 Migraine without aura, not intractable, without status migrainosus: Secondary | ICD-10-CM

## 2023-03-11 DIAGNOSIS — Z87891 Personal history of nicotine dependence: Secondary | ICD-10-CM

## 2023-03-11 HISTORY — DX: Cannabis use, unspecified, in remission: F12.91

## 2023-03-11 LAB — WET PREP FOR TRICH, YEAST, CLUE
Trichomonas Exam: NEGATIVE
Yeast Exam: NEGATIVE

## 2023-03-11 LAB — HEMOGLOBIN, FINGERSTICK: Hemoglobin: 12.9 g/dL (ref 11.1–15.9)

## 2023-03-11 NOTE — Progress Notes (Signed)
 Hgb 12.9 wet prep negative. Scheduled [place] date and time given with instructions to arrive at 9:45 with a full bladder. Tawny Hopping, RN

## 2023-03-11 NOTE — Progress Notes (Addendum)
 Here today for 12.0 week MH IP. Taking PNV every day. Denies ED/hospital visits since +PT. Wants Mat21 if eligible. Declines Flu vaccine. Tawny Hopping, RN

## 2023-03-11 NOTE — Progress Notes (Signed)
 [facility] [address] [place] [address] Main phone: [phone]  Initial Prenatal Visit  Subjective:  Chelsea Glenn is a 23 y.o. G2P0010 at [redacted]w[redacted]d being seen today to start prenatal care at the [facility] Department. She is currently monitored for the following issues for this low-risk pregnancy:   Patient Active Problem List   Diagnosis Date Noted   Supervision of other normal pregnancy, antepartum 03/11/2023   History of marijuana use 03/11/2023   History of nicotine vaping 09/21/2022   Abnormal Pap smear of cervix 11/16/20 ASCUS 11/09/2021   Migraine without aura and without status migrainosus, not intractable 09/10/2013   Patient reports nausea.  Contractions: Not present. Vag. Bleeding: None.  Movement: Absent. Denies leaking of fluid.   Indications for ASA therapy  One of the following: Previous pregnancy with preeclampsia, especially early onset and with an adverse outcome No  Multifetal gestation No  Chronic hypertension No  Type 1 or 2 diabetes mellitus No  Chronic kidney disease No  Autoimmune disease (antiphospholipid syndrome, systemic lupus erythematosus) No   Two or more of the following: Nulliparity  No  Obesity (body mass index >30 kg/m2) No  Family history of preeclampsia in mother or sister No  Age >=35 years No  Sociodemographic characteristics (African American race, low socioeconomic level) Yes  Personal risk factors (eg, previous pregnancy with low birth weight or small for gestational age infant, previous adverse pregnancy outcome [eg, stillbirth], interval >10 years between pregnancies) No   The following portions of the patient's history were reviewed and updated as appropriate: allergies, current medications, past family history, past medical history, past social history, past surgical history and problem list. Problem list updated.  Objective:   Vitals:    03/11/23 0836  BP: 112/71  Pulse: 96  Temp: (!) 97.2 F (36.2 C)  Weight: 150 lb 9.6 oz (68.3 kg)   Fetal Status: Fetal Heart Rate (bpm): 165 Fundal Height: 12 cm Movement: Absent      Physical Exam Vitals and nursing note reviewed. Exam conducted with a chaperone present Nadia PEAK).  Constitutional:      General: She is not in acute distress.    Appearance: Normal appearance. She is well-developed.  HENT:     Head: Normocephalic and atraumatic.     Right Ear: External ear normal.     Left Ear: External ear normal.     Nose: Nose normal. No congestion or rhinorrhea.     Mouth/Throat:     Lips: Pink.     Mouth: Mucous membranes are moist.     Dentition: Normal dentition. No dental caries.     Pharynx: Oropharynx is clear. Uvula midline.     Comments: Dentition: good Eyes:     General: No scleral icterus.    Conjunctiva/sclera: Conjunctivae normal.  Neck:     Thyroid: No thyroid mass or thyromegaly.  Cardiovascular:     Rate and Rhythm: Normal rate.     Pulses: Normal pulses.     Comments: Extremities are warm and well perfused Pulmonary:     Effort: Pulmonary effort is normal.     Breath sounds: Normal breath sounds.  Chest:     Chest wall: No mass.  Breasts:    Tanner Score is 5.     Breasts are symmetrical.     Right: Normal. No mass, nipple discharge or skin change.     Left: Normal. No mass, nipple discharge or skin  change.  Abdominal:     General: Abdomen is flat.     Palpations: Abdomen is soft.     Tenderness: There is no abdominal tenderness.     Comments: Gravid   Genitourinary:    General: Normal vulva.     Exam position: Lithotomy position.     Pubic Area: No rash.      Labia:        Right: No rash.        Left: No rash.      Vagina: Normal. No vaginal discharge.     Cervix: No cervical motion tenderness or friability.     Uterus: Normal. Enlarged (Gravid 12 size). Not tender.      Adnexa: Right adnexa normal and left adnexa normal.     Rectum:  Normal. No external hemorrhoid.       Comments: 1 & 2- flesh colored pedunculated lesions without tenderness rash, discoloration, and discharge  3- flat flesh colored soft papule- without tenderness rash, discharge, or discoloration  Linear healed shallow erosions of labia majora (straight lines) Musculoskeletal:     Right lower leg: No edema.     Left lower leg: No edema.  Lymphadenopathy:     Cervical: No cervical adenopathy.     Upper Body:     Right upper body: No axillary adenopathy.     Left upper body: No axillary adenopathy.  Skin:    General: Skin is warm.     Capillary Refill: Capillary refill takes less than 2 seconds.  Neurological:     Mental Status: She is alert.     Assessment and Plan:  Pregnancy: G2P0010 at [redacted]w[redacted]d  1. Supervision of other normal pregnancy, antepartum (Primary) 23 year old female in clinic today for prenatal care. Patient excited about the pregnancy. Lives with roommate. Plans to move in with FOB once the baby is born.  Is not working currently.  -Patient states last LMP 12/17/22, dating U/S submitted.   -Patient states she is taking PNV daily. -Patient reports some nausea.  Patient encouraged to eat meals high in protein every 2 hours for nausea and also given resource sheet. Will continue to monitor.   -PAP= up to date- due in 11/2023 -Denies alcohol and drug use.  UDS not indicated. -Patient received dental care last year.  Patient given dental information.  -Agrees to Maternti21 -Reviewed recommendations for COVID and FLU vaccines- declines both today -ASA- not indicated based on ACOG guidelines  -total weight gain of 15-25 discussed today -EDPS= 8, will continue to monitor.  -Hgb= wnl    - WET PREP FOR TRICH, YEAST, CLUE - Hemoglobin, fingerstick  2. Atypical squamous cells of undetermined significance on cytologic smear of cervix (ASC-US ) -up to date, last Wnl on 11/2022 -next due in 11/2023  3. History of marijuana use -reports  no use since pregnancy  4. History of nicotine vaping -reports she quit last summer  5. Migraine without aura and without status migrainosus, not intractable -reports HA approx 1x/week, goes to sleep to feel better -reviewed at Tylenol  is safe in pregnancy   Discussed overview of care and coordination with inpatient delivery practices including [facility],  [facility].   Reviewed Centering pregnancy as standard of care at [facility], oriented to room. Based on EDD, plan for Cycle 1 .   Preterm labor symptoms and general obstetric precautions including but not limited to vaginal bleeding, contractions, leaking of fluid and fetal movement were reviewed in detail with the  patient.  Please refer to After Visit Summary for other counseling recommendations.   Return in about 4 weeks (around 04/08/2023) for Routine Prenatal Care.  Future Appointments  Date Time Provider Department Center  03/18/2023 10:00 AM [facility]  3 [facility]  [facility]    Verneta Bers, [place]

## 2023-03-17 LAB — PREGNANCY, INITIAL SCREEN
Antibody Screen: NEGATIVE
Basophils Absolute: 0.1 10*3/uL (ref 0.0–0.2)
Basos: 1 %
Bilirubin, UA: NEGATIVE
Chlamydia trachomatis, NAA: NEGATIVE
EOS (ABSOLUTE): 0.2 10*3/uL (ref 0.0–0.4)
Eos: 2 %
HCV Ab: NONREACTIVE
HIV Screen 4th Generation wRfx: NONREACTIVE
Hematocrit: 40 % (ref 34.0–46.6)
Hemoglobin: 12.9 g/dL (ref 11.1–15.9)
Hepatitis B Surface Ag: NEGATIVE
Immature Grans (Abs): 0 10*3/uL (ref 0.0–0.1)
Immature Granulocytes: 0 %
Ketones, UA: NEGATIVE
Lymphocytes Absolute: 1.5 10*3/uL (ref 0.7–3.1)
Lymphs: 17 %
MCH: 28.8 pg (ref 26.6–33.0)
MCHC: 32.3 g/dL (ref 31.5–35.7)
MCV: 89 fL (ref 79–97)
Monocytes Absolute: 0.6 10*3/uL (ref 0.1–0.9)
Monocytes: 7 %
Neisseria Gonorrhoeae by PCR: NEGATIVE
Neutrophils Absolute: 6.3 10*3/uL (ref 1.4–7.0)
Neutrophils: 73 %
Nitrite, UA: NEGATIVE
Platelets: 381 10*3/uL (ref 150–450)
Protein,UA: NEGATIVE
RBC: 4.48 x10E6/uL (ref 3.77–5.28)
RDW: 14.1 % (ref 11.7–15.4)
RPR Ser Ql: NONREACTIVE
Rh Factor: POSITIVE
Rubella Antibodies, IGG: 1.61 {index} (ref >0.99)
Specific Gravity, UA: 1.012 (ref 1.005–1.030)
Urobilinogen, Ur: 0.2 mg/dL (ref 0.2–1.0)
WBC: 8.6 10*3/uL (ref 3.4–10.8)
pH, UA: 6.5 (ref 5.0–7.5)

## 2023-03-17 LAB — MATERNIT 21 PLUS CORE, BLOOD
Fetal Fraction: 19
Result (T21): NEGATIVE
Trisomy 13 (Patau syndrome): NEGATIVE
Trisomy 18 (Edwards syndrome): NEGATIVE
Trisomy 21 (Down syndrome): NEGATIVE

## 2023-03-17 LAB — HGB FRACTIONATION CASCADE
Hgb A2: 2.7 % (ref 1.8–3.2)
Hgb A: 97.3 % (ref 96.4–98.8)
Hgb F: 0 % (ref 0.0–2.0)
Hgb S: 0 %

## 2023-03-17 LAB — HCV INTERPRETATION

## 2023-03-17 LAB — MICROSCOPIC EXAMINATION
Bacteria, UA: NONE SEEN
Casts: NONE SEEN /[LPF]
RBC, Urine: NONE SEEN /[HPF] (ref 0–2)

## 2023-03-17 LAB — URINE CULTURE, OB REFLEX

## 2023-03-18 ENCOUNTER — Other Ambulatory Visit: Payer: Self-pay | Admitting: Family Medicine

## 2023-03-18 ENCOUNTER — Ambulatory Visit
Admission: RE | Admit: 2023-03-18 | Discharge: 2023-03-18 | Disposition: A | Discharge status: Home / Self Care | Payer: Self-pay | Source: Ambulatory Visit | Attending: Family Medicine | Admitting: Family Medicine

## 2023-03-18 DIAGNOSIS — Z3A13 13 weeks gestation of pregnancy: Secondary | ICD-10-CM | POA: Insufficient documentation

## 2023-03-18 DIAGNOSIS — O209 Hemorrhage in early pregnancy, unspecified: Secondary | ICD-10-CM | POA: Insufficient documentation

## 2023-03-18 DIAGNOSIS — Z3480 Encounter for supervision of other normal pregnancy, unspecified trimester: Secondary | ICD-10-CM | POA: Insufficient documentation

## 2023-03-18 DIAGNOSIS — G43009 Migraine without aura, not intractable, without status migrainosus: Secondary | ICD-10-CM

## 2023-03-18 DIAGNOSIS — Z87891 Personal history of nicotine dependence: Secondary | ICD-10-CM

## 2023-03-18 DIAGNOSIS — F1291 Cannabis use, unspecified, in remission: Secondary | ICD-10-CM

## 2023-03-18 DIAGNOSIS — R87610 Atypical squamous cells of undetermined significance on cytologic smear of cervix (ASC-US): Secondary | ICD-10-CM

## 2023-03-19 ENCOUNTER — Encounter: Payer: Self-pay | Admitting: Family Medicine

## 2023-03-19 ENCOUNTER — Other Ambulatory Visit: Payer: Self-pay | Admitting: Family Medicine

## 2023-03-19 NOTE — Progress Notes (Signed)
Dating ultrasound on 03/18/23 consistent with [redacted]w[redacted]d gestational age. Two distinct subchorionic hemorrhages noted on [place]. I have reached out to MFM, will update chart with recommendations.   EDD by [place] c/w LMP - will use LMP for dating.   Fayette Pho, MD 03/19/23  1:39 PM

## 2023-03-20 ENCOUNTER — Encounter: Payer: Self-pay | Admitting: Family Medicine

## 2023-03-20 DIAGNOSIS — O468X9 Other antepartum hemorrhage, unspecified trimester: Secondary | ICD-10-CM | POA: Insufficient documentation

## 2023-04-08 ENCOUNTER — Ambulatory Visit: Payer: Managed Care, Other (non HMO) | Admitting: Advanced Practice Midwife

## 2023-04-08 VITALS — BP 114/78 | HR 94 | Temp 98.3°F | Wt 153.2 lb

## 2023-04-08 DIAGNOSIS — Z23 Encounter for immunization: Secondary | ICD-10-CM | POA: Diagnosis not present

## 2023-04-08 DIAGNOSIS — Z87891 Personal history of nicotine dependence: Secondary | ICD-10-CM | POA: Diagnosis not present

## 2023-04-08 DIAGNOSIS — Z3482 Encounter for supervision of other normal pregnancy, second trimester: Secondary | ICD-10-CM

## 2023-04-08 DIAGNOSIS — Z3480 Encounter for supervision of other normal pregnancy, unspecified trimester: Secondary | ICD-10-CM

## 2023-04-08 DIAGNOSIS — F1291 Cannabis use, unspecified, in remission: Secondary | ICD-10-CM

## 2023-04-08 LAB — URINALYSIS
Bilirubin, UA: NEGATIVE
Glucose, UA: NEGATIVE
Ketones, UA: NEGATIVE
Nitrite, UA: NEGATIVE
Protein,UA: NEGATIVE
Specific Gravity, UA: 1.025 (ref 1.005–1.030)
Urobilinogen, Ur: 0.2 mg/dL (ref 0.2–1.0)
pH, UA: 6.5 (ref 5.0–7.5)

## 2023-04-08 NOTE — Progress Notes (Signed)
 Patient here for MH RV at 16 weeks. Desires flu vaccine today. Desires AFP today.Rosiland Cooks, RN

## 2023-04-08 NOTE — Progress Notes (Addendum)
 Flu vaccine given, tolerated well, VIS and NCIR given.. Patient scheduled for anatomy U/S  on 04/29/23 at 1:00, to arrive 12:30.. Going to [facility] today and to see Maggie at window 8 about Medicaid. Patient states she will schedule a 4 week maternity appointment and leave more urine in the lab before she leaves today. Urine dip reviewed.Aaron AasAaron AasRosiland Cooks, RN

## 2023-04-08 NOTE — Progress Notes (Signed)
  [facility] [address] [place] [address] Main phone: [phone]  Prenatal Visit  Subjective:  Chelsea Glenn is a 23 y.o. G2P0010 at [redacted]w[redacted]d being seen today for ongoing prenatal care.  She is currently monitored for the following issues for this low-risk pregnancy:   Patient Active Problem List   Diagnosis Date Noted   Subchorionic hemorrhage 03/20/2023   Supervision of other normal pregnancy, antepartum 03/11/2023   History of marijuana use 03/11/2023   History of nicotine vaping 09/21/2022   Abnormal Pap smear of cervix 11/16/20 ASCUS 11/09/2021   Migraine without aura and without status migrainosus, not intractable 09/10/2013   Patient reports no complaints.  Contractions: Not present. Vag. Bleeding: None.  Movement: Absent. Denies leaking of fluid/ROM.   The following portions of the patient's history were reviewed and updated as appropriate: allergies, current medications, past family history, past medical history, past social history, past surgical history and problem list. Problem list updated.  Objective:   Vitals:   04/08/23 0902  BP: 114/78  Pulse: 94  Temp: 98.3 F (36.8 C)  Weight: 153 lb 3.2 oz (69.5 kg)    Fetal Status: Fetal Heart Rate (bpm): 154 Fundal Height: 18 cm Movement: Absent     General:  Alert, oriented and cooperative. Patient is in no acute distress.  Skin: Skin is warm and dry. No rash noted.   Cardiovascular: Normal heart rate noted  Respiratory: Normal respiratory effort, no problems with respiration noted  Abdomen: Soft, gravid, appropriate for gestational age.  Pain/Pressure: Absent     Pelvic: Cervical exam deferred        Extremities: Normal range of motion.  Edema: None  Mental Status: Normal mood and affect. Normal behavior. Normal judgment and thought content.   Assessment and Plan:  Pregnancy: G2P0010 at [redacted]w[redacted]d  1. Supervision of other normal pregnancy,  antepartum (Primary) Not working NIPS 03/11/23 neg AFP only today 8 lb 3.2 oz (3.719 kg) 3 lb wt gain in last 4 wks Needs anatomy u/s --ordered Reviewed 03/18/23 u/s at 13.0 wks  - AFP, Serum, Open Spina Bifida - Urinalysis (Urine Dip)  2. History of nicotine vaping Pt denies use x 2 years  3. History of marijuana use Pt denies use  Pt agrees to UDS  - ToxAssure Flex 15, Ur   Preterm labor symptoms and general obstetric precautions including but not limited to vaginal bleeding, contractions, leaking of fluid and fetal movement were reviewed in detail with the patient. Please refer to After Visit Summary for other counseling recommendations.  Return in about 4 weeks (around 05/06/2023) for routine PNC.  No future appointments.  Rian Chafe, CNM

## 2023-04-11 LAB — TOXASSURE FLEX 15, UR
6-ACETYLMORPHINE IA: NEGATIVE ng/mL
7-aminoclonazepam: NOT DETECTED ng/mg{creat}
AMPHETAMINES IA: NEGATIVE ng/mL
Alpha-hydroxyalprazolam: NOT DETECTED ng/mg{creat}
Alpha-hydroxymidazolam: NOT DETECTED ng/mg{creat}
Alpha-hydroxytriazolam: NOT DETECTED ng/mg{creat}
Alprazolam: NOT DETECTED ng/mg{creat}
BARBITURATES IA: NEGATIVE ng/mL
BUPRENORPHINE: NEGATIVE
Benzodiazepines: NEGATIVE
Buprenorphine: NOT DETECTED ng/mg{creat}
CANNABINOIDS IA: NEGATIVE ng/mL
COCAINE METABOLITE IA: NEGATIVE ng/mL
Clonazepam: NOT DETECTED ng/mg{creat}
Creatinine: 114 mg/dL
Desalkylflurazepam: NOT DETECTED ng/mg{creat}
Desmethyldiazepam: NOT DETECTED ng/mg{creat}
Desmethylflunitrazepam: NOT DETECTED ng/mg{creat}
Diazepam: NOT DETECTED ng/mg{creat}
ETHYL ALCOHOL Enzymatic: NEGATIVE g/dL
FENTANYL: NEGATIVE
Fentanyl: NOT DETECTED ng/mg{creat}
Flunitrazepam: NOT DETECTED ng/mg{creat}
Lorazepam: NOT DETECTED ng/mg{creat}
METHADONE IA: NEGATIVE ng/mL
METHADONE MTB IA: NEGATIVE ng/mL
Midazolam: NOT DETECTED ng/mg{creat}
Norbuprenorphine: NOT DETECTED ng/mg{creat}
Norfentanyl: NOT DETECTED ng/mg{creat}
OPIATE CLASS IA: NEGATIVE ng/mL
OXYCODONE CLASS IA: NEGATIVE ng/mL
Oxazepam: NOT DETECTED ng/mg{creat}
PHENCYCLIDINE IA: NEGATIVE ng/mL
TAPENTADOL, IA: NEGATIVE ng/mL
TRAMADOL IA: NEGATIVE ng/mL
Temazepam: NOT DETECTED ng/mg{creat}

## 2023-04-11 LAB — AFP, SERUM, OPEN SPINA BIFIDA
AFP MoM: 1.1
AFP Value: 35.8 ng/mL
Gest. Age on Collection Date: 16 wk
Maternal Age At EDD: 24.6 a
OSBR Risk 1 IN: 8933
Test Results:: NEGATIVE
Weight: 153 [lb_av]

## 2023-04-29 ENCOUNTER — Ambulatory Visit
Admission: RE | Admit: 2023-04-29 | Discharge: 2023-04-29 | Disposition: A | Discharge status: Home / Self Care | Payer: Medicaid Other | Source: Ambulatory Visit | Attending: Advanced Practice Midwife | Admitting: Advanced Practice Midwife

## 2023-04-29 DIAGNOSIS — Z3A18 18 weeks gestation of pregnancy: Secondary | ICD-10-CM | POA: Diagnosis not present

## 2023-04-29 DIAGNOSIS — O321XX0 Maternal care for breech presentation, not applicable or unspecified: Secondary | ICD-10-CM | POA: Insufficient documentation

## 2023-04-29 DIAGNOSIS — Z3480 Encounter for supervision of other normal pregnancy, unspecified trimester: Secondary | ICD-10-CM

## 2023-04-29 DIAGNOSIS — Z3689 Encounter for other specified antenatal screening: Secondary | ICD-10-CM | POA: Insufficient documentation

## 2023-05-01 ENCOUNTER — Telehealth: Payer: Self-pay | Admitting: Family Medicine

## 2023-05-01 NOTE — Progress Notes (Deleted)
 Centering Pregnancy, Session#1: Introduction to model of care. Group determined rules for self-governance and closing phrase. Oriented group to space and mother's notebook.   Facilitated discussion today:  nutrition and oral health.  Mindfulness activity completed as well as introduction to deep breathing for childbirth preparation.    Fundal height and FHR appropriate today unless noted otherwise in plan. Patient to continue group care.     [facility] FNP-C

## 2023-05-02 ENCOUNTER — Ambulatory Visit: Admitting: Family Medicine

## 2023-05-02 ENCOUNTER — Ambulatory Visit: Payer: Managed Care, Other (non HMO) | Admitting: Family Medicine

## 2023-05-02 ENCOUNTER — Encounter: Payer: Self-pay | Admitting: Family Medicine

## 2023-05-02 VITALS — BP 103/65 | HR 102 | Wt 156.8 lb

## 2023-05-02 DIAGNOSIS — Z3A19 19 weeks gestation of pregnancy: Secondary | ICD-10-CM

## 2023-05-02 DIAGNOSIS — Z3480 Encounter for supervision of other normal pregnancy, unspecified trimester: Secondary | ICD-10-CM

## 2023-05-02 DIAGNOSIS — Z3482 Encounter for supervision of other normal pregnancy, second trimester: Secondary | ICD-10-CM

## 2023-05-02 NOTE — Progress Notes (Signed)
 Centering Pregnancy, Session#1: Introduction to model of care. Group determined rules for self-governance and closing phrase. Oriented group to space and mother's notebook.   Facilitated discussion today:  nutrition and oral health.  Mindfulness activity completed as well as introduction to deep breathing for childbirth preparation.    Fundal height and FHR appropriate today unless noted otherwise in plan. Patient to continue group care.     1. [redacted] weeks gestation of pregnancy -AFP negative 04/08/23 -Anatomy US done 3/3? Report not available as of today  2. Supervision of other normal pregnancy, antepartum (Primary) -ASA not indicated at initial OB -taking PNV daily   [facility] FNP-C

## 2023-05-02 NOTE — Progress Notes (Signed)
  [facility] [address] [place] [address] Main phone: [phone]  Prenatal Visit  Subjective:  Chelsea Glenn is a 23 y.o. G2P0010 at [redacted]w[redacted]d being seen today for ongoing prenatal care.  She is currently monitored for the following issues for this low-risk pregnancy:   Patient Active Problem List   Diagnosis Date Noted   Subchorionic hemorrhage 03/20/2023   Supervision of other normal pregnancy, antepartum 03/11/2023   History of marijuana use 03/11/2023   History of nicotine vaping 09/21/2022   Abnormal Pap smear of cervix 11/16/20 ASCUS 11/09/2021   Migraine without aura and without status migrainosus, not intractable 09/10/2013   Patient reports no complaints.  Contractions: Not present. Vag. Bleeding: None.  Movement: Absent. Denies leaking of fluid/ROM.   The following portions of the patient's history were reviewed and updated as appropriate: allergies, current medications, past family history, past medical history, past social history, past surgical history and problem list. Problem list updated.  Objective:   Vitals:   05/02/23 0938  BP: 103/65  Pulse: (!) 102  Weight: 156 lb 12.8 oz (71.1 kg)    Fetal Status: Fetal Heart Rate (bpm): 145 Fundal Height: 20 cm Movement: Absent     General:  Alert, oriented and cooperative. Patient is in no acute distress.  Skin: Skin is warm and dry. No rash noted.   Cardiovascular: Normal heart rate noted  Respiratory: Normal respiratory effort, no problems with respiration noted  Abdomen: Soft, gravid, appropriate for gestational age.  Pain/Pressure: Present     Pelvic: Cervical exam deferred        Extremities: Normal range of motion.  Edema: None  Mental Status: Normal mood and affect. Normal behavior. Normal judgment and thought content.   Assessment and Plan:  Pregnancy: G2P0010 at [redacted]w[redacted]d   1. [redacted] weeks gestation of pregnancy -AFP negative  04/08/23 -Anatomy US done 3/3. Report not available as of today  2. Supervision of other normal pregnancy, antepartum (Primary) -ASA not indicated at initial OB -taking PNV daily  Centering Pregnancy, Session#1: Introduction to model of care. Group determined rules for self-governance and closing phrase. Oriented group to space and mother's notebook.   Facilitated discussion today:  nutrition and discomforts of pregnancy, PTL sheet given Mindfulness activity completed as well as introduction to deep breathing for childbirth preparation.    Fundal height and FHR appropriate today unless noted otherwise in plan. Patient to continue group care.     Preterm labor symptoms and general obstetric precautions including but not limited to vaginal bleeding, contractions, leaking of fluid and fetal movement were reviewed in detail with the patient. Please refer to After Visit Summary for other counseling recommendations.  Return in about 4 weeks (around 05/30/2023) for Centering Pregnancy.  Future Appointments  Date Time Provider Department Center  05/06/2023 11:00 AM [facility] PROVIDER [facility] None    Lenice Llamas, FNP

## 2023-05-02 NOTE — Progress Notes (Signed)
 Here for Centering pregnancy Group 1, cycle.Burt Knack, RN

## 2023-05-06 ENCOUNTER — Ambulatory Visit: Payer: Managed Care, Other (non HMO)

## 2023-05-13 ENCOUNTER — Ambulatory Visit: Admitting: Family Medicine

## 2023-05-13 ENCOUNTER — Telehealth: Payer: Self-pay

## 2023-05-13 VITALS — BP 105/68 | HR 89 | Temp 97.0°F | Wt 157.2 lb

## 2023-05-13 DIAGNOSIS — N898 Other specified noninflammatory disorders of vagina: Secondary | ICD-10-CM

## 2023-05-13 DIAGNOSIS — A630 Anogenital (venereal) warts: Secondary | ICD-10-CM

## 2023-05-13 DIAGNOSIS — Z3480 Encounter for supervision of other normal pregnancy, unspecified trimester: Secondary | ICD-10-CM

## 2023-05-13 DIAGNOSIS — Z3482 Encounter for supervision of other normal pregnancy, second trimester: Secondary | ICD-10-CM

## 2023-05-13 LAB — WET PREP FOR TRICH, YEAST, CLUE
Trichomonas Exam: NEGATIVE
Yeast Exam: NEGATIVE

## 2023-05-13 NOTE — Assessment & Plan Note (Signed)
 PROCEDURE:   Cryotherapy Preoperative diagnosis: genital wart Postoperative diagnosis: genital wart  Procedure: Cryotherapy of skin lesions Preprocedure counseling: The risks, benefits, and alternatives of the procedure were discussed with the patient.   EBL: 0 ml Anesthesia: None  Lesions identified: 1. Right buttock 1 inch from anus 2. Left perineum #1 3. Left perineum #2 4. Left perineum #3  A test freeze was performed ensuring coverage of entire area as above.  The cryotherapy solution was then applied for 3 seconds until an ice ball formed with a 5-7 mm border.  This was allowed to thaw and then the cryotherapy was again applied for 3 seconds to an ice ball of 5-7 mm.    The patient tolerated the procedure well.  Return precautions provided.

## 2023-05-13 NOTE — Progress Notes (Signed)
 Here today for STI check. Patient is 21.[redacted] weeks pregnant. Declines bloodwork has next scheduled MH appt 05/30/23. Tawny Hopping, RN

## 2023-05-13 NOTE — Patient Instructions (Signed)
 Pregnancy Continue taking your prenatal vitamin daily.  I have ordered a follow up ultrasound to finish looking at all of baby's anatomy.  I have asked for a morning [place] appointment. We will clal you with the info (also available in your MyChart app).   Vulvar warts (Genital Warts) Today we froze some of the most troublesome warts (cryotherapy).  Please keep the area clean and dry while this area heals.   Prenatal Classes If delivering at [facility] with [facility] or [facility]: Go to [email or web address]   If delivering at [facility]: Go to [email or web address].[email or web address].[email or web address]/ and search for: "Pregnancy and Parenting Classes" "Prepared Childbirth Classes"  Resource regarding exposures that could affect pregnancy: Mother To [facility] [employer] is a Dentist with lots of information on the effects of many medications and exposures on your pregnancy. It is free to use, including their web site, phone line, text service, app, or email and live chat.  http://golden-thomas.org/ Email or live chat: MotherToBaby.org Phone: (618)[phone] Text: 208-595-3011 App: search "LactRx"   Maternal Mental Health If you start to develop the below symptoms of depression, please reach out to [place] for an appointment. There is also a Biomedical scientist Health Hotline at ([phone]). This hotline has trained counselors, doulas, and midwifes to real-time support, information, and resources.  Feeling sad or hopeless most of the time Lack of interest in things you used to enjoy Less interest in caring for yourself (dressing, fixing hair) Trouble concentrating Trouble coping with daily tasks Constant worry about your baby Sleeping or eating too much or too little Feeling very anxious or nervous Unexplained irritability or anger Unwanted or scary thoughts Feeling that you are not a good mother Thoughts of hurting yourself or  your baby  If you feel you are experiencing a mental health crisis, please reach out to the National Suicide Prevention Hotline at 1-800-273-TALK 458-243-3894).

## 2023-05-13 NOTE — Progress Notes (Signed)
 [facility] [address], [address] [place] [address] Main phone: [phone]  Prenatal Visit  Subjective:  Chelsea Glenn is a 23 y.o. G2P0010 at [redacted]w[redacted]d being seen today for ongoing prenatal care.  She is currently monitored for the following issues for this low-risk pregnancy:   Patient Active Problem List   Diagnosis Date Noted   Genital warts 05/13/2023   Vaginal pruritus 05/13/2023   Subchorionic hemorrhage 03/20/2023   Supervision of other normal pregnancy, antepartum 03/11/2023   History of marijuana use 03/11/2023   History of nicotine vaping 09/21/2022   Abnormal Pap smear of cervix 11/16/20 ASCUS 11/09/2021   Migraine without aura and without status migrainosus, not intractable 09/10/2013   Patient reports  symptoms - see below .  Contractions: Not present. Vag. Bleeding: None.  Movement: Present. Denies leaking of fluid/ROM.   3 weeks of vaginal dicharge, odor, sharp brief vulvar pains Genital warts becoming more painful  The following portions of the patient's history were reviewed and updated as appropriate: allergies, current medications, past family history, past medical history, past social history, past surgical history and problem list. Problem list updated.  Objective:   Vitals:   05/13/23 0854  BP: 105/68  Pulse: 89  Temp: (!) 97 F (36.1 C)  Weight: 157 lb 3.2 oz (71.3 kg)   Fetal Status: Fetal Heart Rate (bpm): 148 Fundal Height: 20 cm (@ umbilicus) Movement: Present    Fundus @ umbilicus, FHR 148 bpm  General:  Alert, oriented and cooperative. Patient is in no acute distress.  Skin: Skin is warm and dry. No rash noted.   Cardiovascular: Normal heart rate noted  Respiratory: Normal respiratory effort, no problems with respiration noted  Abdomen: Soft, gravid, appropriate for gestational age.  Pain/Pressure: Absent     Pelvic: Speculum exam performed, thin copious white vaginal discharge  present in vault, areas with thick white discharge, scattered small circular vulvar and perineal warts (4 in total)  Extremities: Normal range of motion.     Mental Status: Normal mood and affect. Normal behavior. Normal judgment and thought content.   Assessment and Plan:  Pregnancy: G2P0010 at [redacted]w[redacted]d  Supervision of other normal pregnancy, antepartum Assessment & Plan: Doing well. BP normal at 105/68. TWG 11 lb 12.8 oz (5.352 kg). Taking prenatal vitamin daily. Next prenatal appointment in 4 weeks .   Anatomy US discussed with patient, incomplete views. Repeat US ordered to [facility] this encounter. Patient aware.   Part of centering pregnancy group, already scheduled for upcoming appointments.    Orders: -     US OB Follow Up; Future  Vaginal pruritus Assessment & Plan: Will obtain wet prep for 3 weeks of vaginal discharge, odor, and pruritus. Will treat per standing order.   Orders: -     WET PREP FOR TRICH, YEAST, CLUE  Genital warts Assessment & Plan: PROCEDURE:   Cryotherapy Preoperative diagnosis: genital wart Postoperative diagnosis: genital wart  Procedure: Cryotherapy of skin lesions Preprocedure counseling: The risks, benefits, and alternatives of the procedure were discussed with the patient.   EBL: 0 ml Anesthesia: None  Lesions identified: 1. Right buttock 1 inch from anus 2. Left perineum #1 3. Left perineum #2 4. Left perineum #3  A test freeze was performed ensuring coverage of entire area as above.  The cryotherapy solution was then applied for 3 seconds until an ice ball formed with a 5-7 mm border.  This was allowed to thaw and then the  cryotherapy was again applied for 3 seconds to an ice ball of 5-7 mm.    The patient tolerated the procedure well.  Return precautions provided.     Preterm labor symptoms and general obstetric precautions including but not limited to vaginal bleeding, contractions, leaking of fluid and fetal movement were reviewed in  detail with the patient. Please refer to After Visit Summary for other counseling recommendations.  No follow-ups on file.  Future Appointments  Date Time Provider Department Center  05/30/2023  9:30 AM [facility] PROVIDER [facility] None  06/27/2023  9:30 AM [facility] PROVIDER [facility] None  07/25/2023  9:30 AM [facility] PROVIDER [facility] None  08/08/2023  9:30 AM [facility] PROVIDER [facility] None  08/22/2023  9:30 AM [facility] PROVIDER AC-[facility] None  09/05/2023  9:30 AM [facility] PROVIDER [facility] None  09/19/2023  9:30 AM [facility] PROVIDER [facility] None  10/03/2023  9:30 AM [facility] PROVIDER AC-[facility] None  10/17/2023  9:30 AM [facility] PROVIDER AC-[facility] None   Clydene Fake, MD

## 2023-05-13 NOTE — Telephone Encounter (Signed)
 Phone call to patient to inform of scheduled follow up US for 05/21/23 @ 1:00 (12:45). No answer and left message to call. Tawny Hopping, RN

## 2023-05-13 NOTE — Progress Notes (Signed)
 Wet prep negative and no treatment indicated per standing order. Wet prep also reviewed by Dr. Larita Fife. Jossie Ng, RN

## 2023-05-13 NOTE — Assessment & Plan Note (Signed)
 Doing well. BP normal at 105/68. TWG 11 lb 12.8 oz (5.352 kg). Taking prenatal vitamin daily. Next prenatal appointment in 4 weeks .   Anatomy US discussed with patient, incomplete views. Repeat US ordered to [facility] this encounter. Patient aware.   Part of centering pregnancy group, already scheduled for upcoming appointments.

## 2023-05-13 NOTE — Assessment & Plan Note (Signed)
 Will obtain wet prep for 3 weeks of vaginal discharge, odor, and pruritus. Will treat per standing order.

## 2023-05-14 NOTE — Telephone Encounter (Signed)
 Call to client to notify her of OB US appt scheduled for 05/21/23 at 1300 at the [facility] Client to arrive to appt by 1245. Left message to call regarding [place] appt scheduled for 05/21/23 and number to call provided. Jossie Ng, RN

## 2023-05-15 ENCOUNTER — Other Ambulatory Visit: Payer: Self-pay | Admitting: Family Medicine

## 2023-05-15 NOTE — Progress Notes (Signed)
 Reviewed anatomy US, performed 04/29/23, read 05/12/23.   GA estimated [redacted]w[redacted]d, largely congruent with assigned GA. No EDD changes.   What anatomy that was able to be visualized was unremarkable. Suboptimal visualization of the posterior fossa, upper lip, LVOT, kidneys, and fetal extremities. Repeat anatomy scheduled for 05/21/23.   A placental lake visualized over anterior inferior placental margin. Will follow on repeat scan.   Fayette Pho, MD 05/15/23  12:26 PM

## 2023-05-17 NOTE — Telephone Encounter (Signed)
 Left message to call regarding [place] appt and number to call provided. Delynn Flavin RN

## 2023-05-21 ENCOUNTER — Ambulatory Visit
Admission: RE | Admit: 2023-05-21 | Discharge: 2023-05-21 | Disposition: A | Discharge status: Home / Self Care | Source: Ambulatory Visit | Attending: Family Medicine | Admitting: Family Medicine

## 2023-05-21 DIAGNOSIS — Z3689 Encounter for other specified antenatal screening: Secondary | ICD-10-CM | POA: Insufficient documentation

## 2023-05-21 DIAGNOSIS — Z3480 Encounter for supervision of other normal pregnancy, unspecified trimester: Secondary | ICD-10-CM | POA: Insufficient documentation

## 2023-05-21 DIAGNOSIS — Z3A22 22 weeks gestation of pregnancy: Secondary | ICD-10-CM | POA: Diagnosis not present

## 2023-05-24 NOTE — Telephone Encounter (Signed)
 U/S ended on 05/21/2023. Results pending. BThiele RN

## 2023-05-29 ENCOUNTER — Telehealth: Payer: Self-pay | Admitting: Family Medicine

## 2023-05-29 NOTE — Progress Notes (Unsigned)
  [facility] [address], [address] [place] [address] Main phone: [phone]  Prenatal Visit  Subjective:  Chelsea Glenn is a 23 y.o. G2P0010 at [redacted]w[redacted]d being seen today for ongoing prenatal care.  She is currently monitored for the following issues for this low-risk pregnancy:   Patient Active Problem List   Diagnosis Date Noted   Genital warts 05/13/2023   Vaginal pruritus 05/13/2023   Subchorionic hemorrhage 03/20/2023   Supervision of other normal pregnancy, antepartum 03/11/2023   History of marijuana use 03/11/2023   History of nicotine vaping 09/21/2022   Abnormal Pap smear of cervix 11/16/20 ASCUS 11/09/2021   Migraine without aura and without status migrainosus, not intractable 09/10/2013   Patient reports {[id number]}.   .  .   . Denies leaking of fluid/ROM.   The following portions of the patient's history were reviewed and updated as appropriate: allergies, current medications, past family history, past medical history, past social history, past surgical history and problem list. Problem list updated.  Objective:  There were no vitals filed for this visit.  Fetal Status:           General:  Alert, oriented and cooperative. Patient is in no acute distress.  Skin: Skin is warm and dry. No rash noted.   Cardiovascular: Normal heart rate noted  Respiratory: Normal respiratory effort, no problems with respiration noted  Abdomen: Soft, gravid, appropriate for gestational age.        Pelvic: Cervical exam deferred        Extremities: Normal range of motion.     Mental Status: Normal mood and affect. Normal behavior. Normal judgment and thought content.   Assessment and Plan:  Pregnancy: G2P0010 at [redacted]w[redacted]d  1. [redacted] weeks gestation of pregnancy ***  2. Supervision of other normal pregnancy, antepartum (Primary) Centering Pregnancy, Session#2: Reviewed rules for self-governance with group. Direct group to  [employer].   Facilitated discussion today:  Oral Health and dental care in pregnancy,  common discomforts of pregnancy, Genetic screening options with AFP/Quad.  Mindfulness activity completed as well as deep breathing with 1 minutes of tension and 1 minute of relaxation for childbirth preparation.   Activity-demonstrated exercises to alleviate back pain  Fundal height and FHR appropriate today unless noted otherwise in plan. Patient to continue group care.    3. Genital warts -treatment done on 05/13/23 in office  4. Vaginal pruritus -improved?   Preterm labor symptoms and general obstetric precautions including but not limited to vaginal bleeding, contractions, leaking of fluid and fetal movement were reviewed in detail with the patient. Please refer to After Visit Summary for other counseling recommendations.  No follow-ups on file.  Future Appointments  Date Time Provider Department Center  05/30/2023  9:30 AM [facility] PROVIDER AC-MAT None  06/27/2023  9:30 AM [facility] PROVIDER AC-MAT None  07/25/2023  9:30 AM [facility] PROVIDER AC-MAT None  08/08/2023  9:30 AM [facility]MH PROVIDER AC-MAT None  08/22/2023  9:30 AM [facility]-MH PROVIDER AC-MAT None  09/05/2023  9:30 AM AC-MH PROVIDER AC-MAT None  09/19/2023  9:30 AM [facility] PROVIDER AC-MAT None  10/03/2023  9:30 AM [facility] PROVIDER AC-MAT None  10/17/2023  9:30 AM [facility] PROVIDER AC-MAT None    Lenice Llamas, FNP

## 2023-05-30 ENCOUNTER — Ambulatory Visit: Admitting: Family Medicine

## 2023-05-30 VITALS — BP 108/71 | HR 94 | Wt 161.4 lb

## 2023-05-30 DIAGNOSIS — A630 Anogenital (venereal) warts: Secondary | ICD-10-CM

## 2023-05-30 DIAGNOSIS — Z3482 Encounter for supervision of other normal pregnancy, second trimester: Secondary | ICD-10-CM

## 2023-05-30 DIAGNOSIS — Z3A23 23 weeks gestation of pregnancy: Secondary | ICD-10-CM

## 2023-05-30 DIAGNOSIS — Z3480 Encounter for supervision of other normal pregnancy, unspecified trimester: Secondary | ICD-10-CM

## 2023-05-30 DIAGNOSIS — N898 Other specified noninflammatory disorders of vagina: Secondary | ICD-10-CM

## 2023-05-30 NOTE — Progress Notes (Signed)
 Here for Centering pregnancy at 23 3/7. Marland KitchenBurt Knack, RN

## 2023-05-31 ENCOUNTER — Encounter: Payer: Self-pay | Admitting: Family Medicine

## 2023-05-31 ENCOUNTER — Other Ambulatory Visit: Payer: Self-pay | Admitting: Family Medicine

## 2023-05-31 NOTE — Progress Notes (Signed)
 Reviewed repeat anatomy US, performed 05/21/23 to complete anatomy survey from 04/29/23. All anatomy viewed and unremarkable, thus completing anatomy survey. Anterior placenta; previously seen hypoechoic area no longer visualized.   Fayette Pho, MD 05/31/23  12:35 PM

## 2023-06-03 NOTE — Addendum Note (Signed)
 Addended by: Heywood Bene on: 06/03/2023 11:17 AM   Modules accepted: Orders

## 2023-06-07 ENCOUNTER — Encounter: Payer: Self-pay | Admitting: Emergency Medicine

## 2023-06-07 ENCOUNTER — Emergency Department
Admission: EM | Admit: 2023-06-07 | Discharge: 2023-06-07 | Disposition: A | Discharge status: Home / Self Care | Attending: Emergency Medicine | Admitting: Emergency Medicine

## 2023-06-07 ENCOUNTER — Other Ambulatory Visit: Payer: Self-pay

## 2023-06-07 DIAGNOSIS — R21 Rash and other nonspecific skin eruption: Secondary | ICD-10-CM | POA: Diagnosis present

## 2023-06-07 NOTE — ED Provider Notes (Signed)
 [facility] AT [facility] Provider Note   CSN: [id number] Arrival date & time: 06/07/23  1336     History  Chief Complaint  Patient presents with   Rash    IBETH FAHMY is a 23 y.o. female.  Patient is [redacted] weeks pregnant here for rash.  She notes 3 days now of bumps, red rash on lower legs.  They appear to start on the ankles and have moved up some.  Has not tried any treatment.  Denies any fevers or chills.  Denies any new abdominal pain or vaginal bleeding has had continued mild abdominal cramping for over a month.   Rash Associated symptoms: no abdominal pain, no fever and no joint pain        Home Medications Prior to Admission medications   Medication Sig Start Date End Date Taking? Authorizing Provider  Prenatal Vit-Fe Fumarate-FA (PRENATAL VITAMINS) 27-0.8 MG TABS Take 1 tablet by mouth daily.    [provider]      Allergies    Penicillins and Coconut fatty acid    Review of Systems   Review of Systems  Constitutional:  Negative for chills and fever.  Gastrointestinal:  Negative for abdominal pain.  Genitourinary:  Negative for vaginal bleeding, vaginal discharge and vaginal pain.  Musculoskeletal:  Negative for arthralgias.  Skin:  Positive for rash.  Neurological:  Negative for weakness and numbness.    Physical Exam Updated Vital Signs BP 117/82 (BP Location: Left Arm)   Pulse 98   Temp 98.9 F (37.2 C) (Oral)   Resp 18   Ht 5\' 3"  (1.6 m)   Wt 73.5 kg   LMP 12/17/2022   SpO2 99%   BMI 28.70 kg/m  Physical Exam Vitals reviewed.  Constitutional:      Appearance: Normal appearance.  HENT:     Head: Normocephalic and atraumatic.     Nose: Nose normal.  Cardiovascular:     Pulses: Normal pulses.  Pulmonary:     Effort: Pulmonary effort is normal.  Musculoskeletal:     Cervical back: Normal range of motion.  Skin:    Comments: Bilateral lower extremities each with about 6 mildly raised blanching  small red lesions with mild tenderness nondraining.  No fluctuance.  Neurological:     Mental Status: She is alert and oriented to person, place, and time. Mental status is at baseline.  Psychiatric:        Mood and Affect: Mood normal.        Behavior: Behavior normal.     ED Results / Procedures / Treatments   Labs (all labs ordered are listed, but only abnormal results are displayed) Labs Reviewed - No data to display  EKG None  Radiology No results found.  Procedures Procedures    Medications Ordered in ED Medications - No data to display  ED Course/ Medical Decision Making/ A&P                                 Medical Decision Making Patient [redacted] weeks pregnant here for known pregnancy related concern as she has no new abdominal pain no vaginal bleeding.  Will check fetal heart tones.  Her concern today is actually for lower extremity rash that seems to be worsening over the past 3 days now.  She is afebrile hemodynamically stable overall well-appearing not reporting any fevers at home.  I do not appreciate any  drainable abscess or significant cellulitis.  Symptoms do appear to be environmental likely insect bite.  I will recommend topical steroid with hydrocortisone 1% and continued observation.  She will follow-up closely with OB/GYN for further evaluation.  Return precautions given           Final Clinical Impression(s) / ED Diagnoses Final diagnoses:  Rash    Rx / DC Orders ED Discharge Orders     None         Hollie Luria, Kirby Peoples 06/07/23 1655    Jacquie Maudlin, MD 06/08/23 0020

## 2023-06-07 NOTE — ED Triage Notes (Signed)
 Pt via POV from home. Reports started having bilateral bumps on her legs, reports it started on Wednesday. States it very painful. Pt has multiple small areas on redness on bilateral legs. Denies fever. Pt is [redacted] weeks pregnant. Pt is A&Ox4 and NAD, ambulatory to triage.

## 2023-06-07 NOTE — ED Notes (Signed)
 This RN attempted FHT on pt, unable to successfully obtain them at this time.

## 2023-06-07 NOTE — Discharge Instructions (Signed)
 Please use hydrocortisone 1% over-the-counter to affected areas.  Monitor for new or worse symptoms and follow-up with primary care or OB/GYN in 1 to 2 days.  Return here for worse symptoms.

## 2023-06-07 NOTE — Telephone Encounter (Signed)
 Can you please call me back I have Red bumps on my legs that are speading and is hard for me to walk

## 2023-06-07 NOTE — ED Notes (Signed)
 Written and verbal discharge instructions reviewed with pt, understanding verbalized, denied questions. Discharged from unit ambulatory in good condition alone.

## 2023-06-12 ENCOUNTER — Encounter: Payer: Self-pay | Admitting: Licensed Practical Nurse

## 2023-06-12 ENCOUNTER — Ambulatory Visit: Admitting: Licensed Practical Nurse

## 2023-06-12 VITALS — BP 119/75 | HR 94 | Wt 161.4 lb

## 2023-06-12 DIAGNOSIS — Z3A25 25 weeks gestation of pregnancy: Secondary | ICD-10-CM | POA: Diagnosis not present

## 2023-06-12 DIAGNOSIS — Z3482 Encounter for supervision of other normal pregnancy, second trimester: Secondary | ICD-10-CM | POA: Diagnosis not present

## 2023-06-12 DIAGNOSIS — Z3480 Encounter for supervision of other normal pregnancy, unspecified trimester: Secondary | ICD-10-CM

## 2023-06-12 DIAGNOSIS — Z131 Encounter for screening for diabetes mellitus: Secondary | ICD-10-CM

## 2023-06-12 DIAGNOSIS — L52 Erythema nodosum: Secondary | ICD-10-CM

## 2023-06-12 LAB — POCT URINALYSIS DIPSTICK
Bilirubin, UA: NEGATIVE
Blood, UA: NEGATIVE
Glucose, UA: NEGATIVE
Ketones, UA: NEGATIVE
Leukocytes, UA: NEGATIVE
Nitrite, UA: NEGATIVE
Protein, UA: NEGATIVE
Spec Grav, UA: 1.02 (ref 1.010–1.025)
Urobilinogen, UA: 0.2 U/dL
pH, UA: 7 (ref 5.0–8.0)
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
## 2023-06-12 NOTE — Assessment & Plan Note (Signed)
-  TWG 16 lbs, which is ok  -FH 24cm, consider growth US  after next visit  -28wk labs and TDAO next visit

## 2023-06-14 ENCOUNTER — Encounter: Payer: Self-pay | Admitting: Licensed Practical Nurse

## 2023-06-14 DIAGNOSIS — L52 Erythema nodosum: Secondary | ICD-10-CM | POA: Insufficient documentation

## 2023-06-25 ENCOUNTER — Other Ambulatory Visit: Payer: Self-pay | Admitting: Licensed Practical Nurse

## 2023-06-25 ENCOUNTER — Ambulatory Visit

## 2023-06-25 ENCOUNTER — Telehealth: Payer: Self-pay

## 2023-06-25 ENCOUNTER — Encounter: Payer: Self-pay | Admitting: Licensed Practical Nurse

## 2023-06-25 DIAGNOSIS — Z3403 Encounter for supervision of normal first pregnancy, third trimester: Secondary | ICD-10-CM

## 2023-06-25 DIAGNOSIS — L52 Erythema nodosum: Secondary | ICD-10-CM

## 2023-06-25 MED ORDER — PREDNISONE 20 MG PO TABS: 20.0000 mg | ORAL_TABLET | Freq: Every day | ORAL | 0 refills | Status: DC

## 2023-06-25 NOTE — Telephone Encounter (Signed)
 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 She was evaluated at the hospital and had been prescribed prednisone. She reports that symptoms have come back after completing course of prednisone. Please advise.

## 2023-06-25 NOTE — Progress Notes (Signed)
 Pt reports worsening symptoms of erythema nodosom after stopping steroids. Requesting more steroids. Dr Grayland Le called, ok to reorder Prednisone, will do another 10 day course, if pt needs to remain on prednisone that is ok. Rec MFM follow up, referral placed. Derm referral placed for biopsy.  Anice Kerbs, CNM  [facility]  06/25/23  5:06 PM

## 2023-06-26 ENCOUNTER — Telehealth: Payer: Self-pay

## 2023-06-26 ENCOUNTER — Telehealth: Payer: Self-pay | Admitting: Family Medicine

## 2023-06-26 NOTE — Telephone Encounter (Signed)
 Phone call to patient to clarify where she is currently receiving PNC. After review of patient appointments patient has completed a new OB appt at [facility] providers on 06/12/23 and has a follow up appt for provider visit and labs for 07/03/23 at 9:40 and 10:35. Received no answer from phone call. Due to patient having scheduled return OB appointments with [facility] providers. 06/27/23 scheduled MH RV at [facility] Department has been cancelled. Eden Goodpasture, RN

## 2023-06-27 ENCOUNTER — Ambulatory Visit

## 2023-06-28 NOTE — Addendum Note (Signed)
 Addended by: Arcelia Bean on: 06/28/2023 01:58 PM   Modules accepted: Orders

## 2023-07-03 ENCOUNTER — Other Ambulatory Visit

## 2023-07-03 ENCOUNTER — Ambulatory Visit (INDEPENDENT_AMBULATORY_CARE_PROVIDER_SITE_OTHER): Admitting: Certified Nurse Midwife

## 2023-07-03 VITALS — BP 103/71 | HR 94 | Wt 162.6 lb

## 2023-07-03 DIAGNOSIS — O99713 Diseases of the skin and subcutaneous tissue complicating pregnancy, third trimester: Secondary | ICD-10-CM

## 2023-07-03 DIAGNOSIS — L52 Erythema nodosum: Secondary | ICD-10-CM | POA: Diagnosis not present

## 2023-07-03 DIAGNOSIS — Z3480 Encounter for supervision of other normal pregnancy, unspecified trimester: Secondary | ICD-10-CM

## 2023-07-03 DIAGNOSIS — Z23 Encounter for immunization: Secondary | ICD-10-CM | POA: Diagnosis not present

## 2023-07-03 DIAGNOSIS — Z131 Encounter for screening for diabetes mellitus: Secondary | ICD-10-CM

## 2023-07-03 DIAGNOSIS — Z3A28 28 weeks gestation of pregnancy: Secondary | ICD-10-CM

## 2023-07-03 MED ORDER — PREDNISONE 20 MG PO TABS: 20.0000 mg | ORAL_TABLET | Freq: Every day | ORAL | 4 refills | Status: DC

## 2023-07-03 NOTE — Assessment & Plan Note (Signed)
 28 week labs drawn today. RhIG not indicated RPR, CBC, HIV and 1-hour GTT sent Reviewed the importance of monitoring fetal movement as well as warning signs for pre-eclampsia and preterm labor.

## 2023-07-03 NOTE — Assessment & Plan Note (Signed)
 Saw dermatology for biopsy yesterday. Will have results in a week. Per previous note, patient is ok to stay on steroids for symptoms management. Has MFM appointment 6/4 for further recommendations.

## 2023-07-03 NOTE — Progress Notes (Signed)
    Return Prenatal Note   Assessment/Plan   Plan  23 y.o. G2P0010 at [redacted]w[redacted]d presents for follow-up OB visit. Reviewed prenatal record including previous visit note.  Erythema nodosum Saw dermatology for biopsy yesterday. Will have results in a week. Per previous note, patient is ok to stay on steroids for symptoms management. Has MFM appointment 6/4 for further recommendations.    Supervision of other normal pregnancy, antepartum 28 week labs drawn today. RhIG not indicated RPR, CBC, HIV and 1-hour GTT sent Reviewed the importance of monitoring fetal movement as well as warning signs for pre-eclampsia and preterm labor.    Orders Placed This Encounter  Procedures   Tdap vaccine greater than or equal to 7yo IM   No follow-ups on file.   Future Appointments  Date Time Provider Department Center  07/25/2023  9:30 AM [facility] PROVIDER AC-MAT None  07/31/2023  1:00 PM [facility] US1 [facility]  07/31/2023  2:00 PM [facility] CONSULT RM [facility] None  08/08/2023  9:30 AM [facility] PROVIDER AC-MAT None  08/22/2023  9:30 AM [facility] PROVIDER AC-MAT None  09/05/2023  9:30 AM [facility] PROVIDER AC-MAT None  09/19/2023  9:30 AM [facility] PROVIDER AC-MAT None  10/03/2023  9:30 AM [facility] PROVIDER AC-MAT None  10/17/2023  9:30 AM [facility] PROVIDER AC-MAT None    For next visit:  continue with routine prenatal care     Subjective   22 y.o. G2P0010 at [redacted]w[redacted]d presents for this follow-up prenatal visit.  Patient has improved symptoms while on prednisone .  Patient reports: Movement: Present Contractions: Not present  Objective   Flow sheet Vitals: Pulse Rate: 94 BP: 103/71 Fundal Height: 28 cm Fetal Heart Rate (bpm): 150 Total weight gain: 17 lb 9.6 oz (7.983 kg)  General Appearance  No acute distress, well appearing, and well nourished Pulmonary   Normal work of breathing Neurologic   Alert and oriented to person, place, and time Psychiatric   Mood and affect within normal limits  Donato Fu, CNM  07/02/2508:36 AM

## 2023-07-05 ENCOUNTER — Other Ambulatory Visit: Payer: Self-pay | Admitting: Licensed Practical Nurse

## 2023-07-05 ENCOUNTER — Encounter: Payer: Self-pay | Admitting: Licensed Practical Nurse

## 2023-07-05 DIAGNOSIS — Z131 Encounter for screening for diabetes mellitus: Secondary | ICD-10-CM

## 2023-07-05 DIAGNOSIS — Z3403 Encounter for supervision of normal first pregnancy, third trimester: Secondary | ICD-10-CM

## 2023-07-05 LAB — 28 WEEK RH+PANEL
Basophils Absolute: 0 10*3/uL (ref 0.0–0.2)
Basos: 0 %
EOS (ABSOLUTE): 0 10*3/uL (ref 0.0–0.4)
Eos: 0 %
Gestational Diabetes Screen: 173 mg/dL — ABNORMAL HIGH (ref 70–139)
HIV Screen 4th Generation wRfx: NONREACTIVE
Hematocrit: 34.2 % (ref 34.0–46.6)
Hemoglobin: 10.5 g/dL — ABNORMAL LOW (ref 11.1–15.9)
Immature Grans (Abs): 0.1 10*3/uL (ref 0.0–0.1)
Immature Granulocytes: 1 %
Lymphocytes Absolute: 1.3 10*3/uL (ref 0.7–3.1)
Lymphs: 15 %
MCH: 27.1 pg (ref 26.6–33.0)
MCHC: 30.7 g/dL — ABNORMAL LOW (ref 31.5–35.7)
MCV: 88 fL (ref 79–97)
Monocytes Absolute: 0.4 10*3/uL (ref 0.1–0.9)
Monocytes: 5 %
Neutrophils Absolute: 7.2 10*3/uL — ABNORMAL HIGH (ref 1.4–7.0)
Neutrophils: 79 %
Platelets: 454 10*3/uL — ABNORMAL HIGH (ref 150–450)
RBC: 3.88 x10E6/uL (ref 3.77–5.28)
RDW: 13.4 % (ref 11.7–15.4)
RPR Ser Ql: NONREACTIVE
WBC: 9 10*3/uL (ref 3.4–10.8)

## 2023-07-05 NOTE — Progress Notes (Signed)
 Pt currently on prednisone , one glucose elevated, reviewed with Dr Luster Salters, will get [facility]. Mychart message sent to pt.  Anice Kerbs, CNM  [facility]  07/05/23  8:59 AM

## 2023-07-11 ENCOUNTER — Other Ambulatory Visit

## 2023-07-11 DIAGNOSIS — Z3403 Encounter for supervision of normal first pregnancy, third trimester: Secondary | ICD-10-CM

## 2023-07-11 DIAGNOSIS — Z131 Encounter for screening for diabetes mellitus: Secondary | ICD-10-CM

## 2023-07-12 LAB — HEMOGLOBIN A1C
Est. average glucose Bld gHb Est-mCnc: 120 mg/dL
Hgb A1c MFr Bld: 5.8 % — ABNORMAL HIGH (ref 4.8–5.6)

## 2023-07-16 ENCOUNTER — Other Ambulatory Visit: Payer: Self-pay | Admitting: Licensed Practical Nurse

## 2023-07-16 ENCOUNTER — Ambulatory Visit: Payer: Self-pay | Admitting: Licensed Practical Nurse

## 2023-07-16 DIAGNOSIS — O99810 Abnormal glucose complicating pregnancy: Secondary | ICD-10-CM

## 2023-07-19 ENCOUNTER — Other Ambulatory Visit: Payer: Self-pay

## 2023-07-19 ENCOUNTER — Observation Stay
Admission: EM | Admit: 2023-07-19 | Discharge: 2023-07-19 | Disposition: A | Discharge status: Home / Self Care | Attending: Obstetrics | Admitting: Obstetrics

## 2023-07-19 DIAGNOSIS — Z3A30 30 weeks gestation of pregnancy: Secondary | ICD-10-CM | POA: Diagnosis not present

## 2023-07-19 DIAGNOSIS — O99891 Other specified diseases and conditions complicating pregnancy: Secondary | ICD-10-CM | POA: Diagnosis not present

## 2023-07-19 DIAGNOSIS — L52 Erythema nodosum: Secondary | ICD-10-CM

## 2023-07-19 DIAGNOSIS — O26893 Other specified pregnancy related conditions, third trimester: Secondary | ICD-10-CM | POA: Diagnosis not present

## 2023-07-19 DIAGNOSIS — M549 Dorsalgia, unspecified: Secondary | ICD-10-CM

## 2023-07-19 DIAGNOSIS — O4693 Antepartum hemorrhage, unspecified, third trimester: Secondary | ICD-10-CM | POA: Diagnosis present

## 2023-07-19 DIAGNOSIS — M545 Low back pain, unspecified: Secondary | ICD-10-CM | POA: Insufficient documentation

## 2023-07-19 DIAGNOSIS — O468X2 Other antepartum hemorrhage, second trimester: Principal | ICD-10-CM

## 2023-07-19 LAB — URINALYSIS, COMPLETE (UACMP) WITH MICROSCOPIC
Bilirubin Urine: NEGATIVE
Glucose, UA: 150 mg/dL — AB
Ketones, ur: NEGATIVE mg/dL
Nitrite: NEGATIVE
Protein, ur: 100 mg/dL — AB
RBC / HPF: >50 RBC/hpf (ref 0–5)
Specific Gravity, Urine: 1.011 (ref 1.005–1.030)
pH: 7 (ref 5.0–8.0)

## 2023-07-19 LAB — WET PREP, GENITAL
Clue Cells Wet Prep HPF POC: NONE SEEN
Sperm: NONE SEEN
Trich, Wet Prep: NONE SEEN
WBC, Wet Prep HPF POC: <10 (ref <10)
Yeast Wet Prep HPF POC: NONE SEEN

## 2023-07-19 MED ORDER — NITROFURANTOIN MONOHYD MACRO 100 MG PO CAPS
100.0000 mg | ORAL_CAPSULE | Freq: Two times a day (BID) | ORAL | Status: DC
  Filled 2023-07-19: qty 1

## 2023-07-19 MED ORDER — LACTATED RINGERS IV SOLN: 500.0000 mL | INTRAVENOUS | Status: DC | PRN

## 2023-07-19 MED ORDER — NITROFURANTOIN MONOHYD MACRO 100 MG PO CAPS: 100.0000 mg | ORAL_CAPSULE | Freq: Two times a day (BID) | ORAL | 0 refills | Status: DC

## 2023-07-19 MED ORDER — ACETAMINOPHEN 325 MG PO TABS: 650.0000 mg | ORAL_TABLET | ORAL | Status: DC | PRN

## 2023-07-19 MED ORDER — ONDANSETRON HCL 4 MG/2ML IJ SOLN: 4.0000 mg | Freq: Four times a day (QID) | INTRAMUSCULAR | Status: DC | PRN

## 2023-07-19 MED ORDER — SOD CITRATE-CITRIC ACID 500-334 MG/5ML PO SOLN: 30.0000 mL | ORAL | Status: DC | PRN

## 2023-07-19 NOTE — OB Triage Note (Signed)
 Discharge instructions, labor precautions, and follow-up care reviewed with patient and significant other. All questions answered. Patient verbalized understanding. Discharged ambulatory off unit.

## 2023-07-19 NOTE — OB Triage Note (Signed)
 LABOR & DELIVERY OB TRIAGE NOTE  SUBJECTIVE  HPI Chelsea Glenn is a 23 y.o. G2P0010 at [redacted]w[redacted]d who presents to Labor & Delivery for vaginal bleeding and back pain. She reports that she saw some blood in her urine but did not have any bleeding when she wiped. She also reports some back pain. She denies ctx and LOF and endorses good fetal movement.  OB History     Gravida  2   Para      Term      Preterm      AB  1   Living         SAB  1   IAB      Ectopic      Multiple      Live Births              Scheduled Meds:  nitrofurantoin (macrocrystal-monohydrate)  100 mg Oral Q12H   Continuous Infusions:  lactated ringers      PRN Meds:.acetaminophen , lactated ringers , ondansetron , sodium citrate-citric acid  OBJECTIVE  BP 120/71   Pulse 99   Temp 98.2 F (36.8 C) (Oral)   LMP 12/17/2022   UA: +blood & leuks Wet prep: negative  NST I reviewed the NST and it was reactive.  Baseline: 140 Variability: moderate Accelerations: present Decelerations:none Toco: no ctx Category 1  ASSESSMENT Impression  1) Pregnancy at G2P0010, [redacted]w[redacted]d, Estimated Date of Delivery: 09/23/23 2) Reassuring maternal/fetal status 3) Presumed UTI  PLAN 1) Macrobid 100 mg PO BID x 7 days. Urine culture sent to confirm 2) Discharge home with standard labor/return precautions 3) Keep scheduled ROB appt  Josue Nip, CNM 07/19/23  10:04 PM

## 2023-07-19 NOTE — OB Triage Note (Signed)
 Pt Chelsea Glenn 22 y.o. presents to labor and delivery triage reporting  increased back pain with some abdominal cramping. Pt is a G2P0010 at [redacted]w[redacted]d . Pt denies signs and symptons consistent with rupture of membranes or active vaginal bleeding. Pt does report some bleeding after she went to the bathroom but none when she wiped. Pt denies contractions and states positive fetal movement. External FM and TOCO applied to non-tender abdomen and assessing. Initial FHR 145 . Vital signs obtained and within normal limits. Provider notified of pt.

## 2023-07-22 LAB — URINE CULTURE: Culture: 80000 — AB

## 2023-07-23 ENCOUNTER — Telehealth: Payer: Self-pay

## 2023-07-23 NOTE — Telephone Encounter (Signed)
 Chart reviewed. Patient reported to L&D as advised.

## 2023-07-23 NOTE — Telephone Encounter (Signed)
 Chelsea Glenn

## 2023-07-24 ENCOUNTER — Telehealth: Payer: Self-pay

## 2023-07-24 NOTE — Telephone Encounter (Signed)
 Per provider called pt to ask if medication for UTI is helping, if pt still has SX please route to Missy.Swanson CNM as she may need a diffrent medication.

## 2023-07-25 ENCOUNTER — Ambulatory Visit

## 2023-07-25 ENCOUNTER — Other Ambulatory Visit

## 2023-07-25 ENCOUNTER — Telehealth: Payer: Self-pay

## 2023-07-25 DIAGNOSIS — O99810 Abnormal glucose complicating pregnancy: Secondary | ICD-10-CM

## 2023-07-25 NOTE — Telephone Encounter (Signed)
 Pt had appt this AM for 3 hr GTT. While sitting in lobby she requested to talk to a CMA. She states she had intercourse yesterday around 3 pm, admits to intercourse being a little rough/tough and noticed spotting when wiping only up until night time. Nothing since it stopped last night. Zero cramping while spotting. Denies spotting and/or cramping today. Vaginal bleeding and/or pain protocol given.

## 2023-07-26 ENCOUNTER — Other Ambulatory Visit: Payer: Self-pay | Admitting: Licensed Practical Nurse

## 2023-07-26 ENCOUNTER — Encounter: Payer: Self-pay | Admitting: Licensed Practical Nurse

## 2023-07-26 ENCOUNTER — Ambulatory Visit: Payer: Self-pay | Admitting: Licensed Practical Nurse

## 2023-07-26 DIAGNOSIS — O24410 Gestational diabetes mellitus in pregnancy, diet controlled: Secondary | ICD-10-CM

## 2023-07-26 DIAGNOSIS — O24419 Gestational diabetes mellitus in pregnancy, unspecified control: Secondary | ICD-10-CM | POA: Insufficient documentation

## 2023-07-26 HISTORY — DX: Gestational diabetes mellitus in pregnancy, unspecified control: O24.419

## 2023-07-26 LAB — GESTATIONAL GLUCOSE TOLERANCE
Glucose, Fasting: 80 mg/dL (ref 70–94)
Glucose, GTT - 1 Hour: 181 mg/dL — ABNORMAL HIGH (ref 70–179)
Glucose, GTT - 2 Hour: 175 mg/dL — ABNORMAL HIGH (ref 70–154)
Glucose, GTT - 3 Hour: 156 mg/dL — ABNORMAL HIGH (ref 70–139)

## 2023-07-26 MED ORDER — ACCU-CHEK GUIDE W/DEVICE KIT: 1.0000 | PACK | Freq: Four times a day (QID) | 0 refills | Status: DC

## 2023-07-26 MED ORDER — GLUCOSE BLOOD VI STRP: 1.0000 | ORAL_STRIP | Freq: Four times a day (QID) | 12 refills | Status: DC

## 2023-07-26 MED ORDER — ACCU-CHEK SOFTCLIX LANCETS MISC: 1.0000 | Freq: Four times a day (QID) | 12 refills | Status: DC

## 2023-07-26 NOTE — Progress Notes (Signed)
 Pt with abnormal 3 hour, Pt called reviewed results and management of GDM Supplies and Nutrition consult ordered Chelsea Glenn   [facility]  07/26/23  11:19 AM

## 2023-07-31 ENCOUNTER — Ambulatory Visit: Attending: Obstetrics

## 2023-07-31 ENCOUNTER — Encounter: Payer: Self-pay | Admitting: Certified Nurse Midwife

## 2023-07-31 ENCOUNTER — Ambulatory Visit (INDEPENDENT_AMBULATORY_CARE_PROVIDER_SITE_OTHER): Admitting: Certified Nurse Midwife

## 2023-07-31 ENCOUNTER — Other Ambulatory Visit: Payer: Self-pay

## 2023-07-31 ENCOUNTER — Ambulatory Visit (HOSPITAL_BASED_OUTPATIENT_CLINIC_OR_DEPARTMENT_OTHER): Admitting: Obstetrics

## 2023-07-31 VITALS — BP 110/73 | HR 103

## 2023-07-31 VITALS — BP 115/74 | HR 102 | Wt 168.6 lb

## 2023-07-31 DIAGNOSIS — L52 Erythema nodosum: Secondary | ICD-10-CM

## 2023-07-31 DIAGNOSIS — O468X3 Other antepartum hemorrhage, third trimester: Secondary | ICD-10-CM

## 2023-07-31 DIAGNOSIS — O24410 Gestational diabetes mellitus in pregnancy, diet controlled: Secondary | ICD-10-CM | POA: Diagnosis not present

## 2023-07-31 DIAGNOSIS — A630 Anogenital (venereal) warts: Secondary | ICD-10-CM

## 2023-07-31 DIAGNOSIS — Z363 Encounter for antenatal screening for malformations: Secondary | ICD-10-CM | POA: Insufficient documentation

## 2023-07-31 DIAGNOSIS — O99713 Diseases of the skin and subcutaneous tissue complicating pregnancy, third trimester: Secondary | ICD-10-CM | POA: Diagnosis not present

## 2023-07-31 DIAGNOSIS — Z3A32 32 weeks gestation of pregnancy: Secondary | ICD-10-CM

## 2023-07-31 DIAGNOSIS — O26893 Other specified pregnancy related conditions, third trimester: Secondary | ICD-10-CM | POA: Diagnosis present

## 2023-07-31 DIAGNOSIS — Z3480 Encounter for supervision of other normal pregnancy, unspecified trimester: Secondary | ICD-10-CM

## 2023-07-31 NOTE — Assessment & Plan Note (Signed)
 Continue QID monitoring, reviewed ideal values for fasting & post-prandial, encouraged documenting of timing of taking as well as meal choices when it is elevated so that changes can be made. Reports good support from partner & family. 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 She has tapered off the steroids and her skin continues to slowly improve.

## 2023-07-31 NOTE — Progress Notes (Signed)
    Return Prenatal Note   Subjective   23 y.o. G2P0010 at [redacted]w[redacted]d presents for this follow-up prenatal visit.  Patient reports pelvic pain-shooting pain that happens for a few seconds, as well as pelvic pressure and tightness, this happens randomly.  Has started checking blood sugars but left monitor at boyfriend's house so hasn't for the last 2 days. Patient reports: Movement: Present Contractions: Irritability  Date Fasting Breakfast Lunch Dinner  5/30    103  5/31  88 112  114 135   6/1 92 172 116  104                             Highest /lowest fasting: 2 hour post prandial Highest/lowest Breakfast: Highest lowest Lunch: Highest/lowest Dinner:   Objective   Flow sheet Vitals: Pulse Rate: (!) 102 BP: 115/74 Total weight gain: 23 lb 9.6 oz (10.7 kg)  General Appearance  No acute distress, well appearing, and well nourished Pulmonary   Normal work of breathing Neurologic   Alert and oriented to person, place, and time Psychiatric   Mood and affect within normal limits  Assessment/Plan   Plan  23 y.o. G2P0010 at [redacted]w[redacted]d presents for follow-up OB visit. Reviewed prenatal record including previous visit note.  Gestational diabetes Continue QID monitoring, reviewed ideal values for fasting & post-prandial, encouraged documenting of timing of taking as well as meal choices when it is elevated so that changes can be made. Reports good support from partner & family. 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 She has tapered off the steroids and her skin continues to slowly improve.  Supervision of other normal pregnancy, antepartum Reviewed kick counts and preterm labor warning signs. Instructed to call office or come to hospital with persistent headache, vision changes, regular contractions, leaking of fluid, decreased fetal movement or vaginal bleeding.   Erythema nodosum Steroids completed.   MFM  appointment today   No orders of the defined types were placed in this encounter.  Return in 2 weeks (on 08/14/2023) for ROB.   Future Appointments  Date Time Provider Department Center  07/31/2023  1:00 PM [facility]  07/31/2023  2:00 PM [facility] CONSULT RM [facility] None  08/08/2023  9:30 AM [facility] PROVIDER [facility] None  08/14/2023  8:55 AM Slaughterbeck, Sherline Distel, CNM [facility] None  08/22/2023  9:30 AM [facility] PROVIDER [facility] None  09/05/2023  9:30 AM [facility] PROVIDER [facility] None  09/19/2023  9:30 AM [facility] PROVIDER [facility] None  10/03/2023  9:30 AM [facility] PROVIDER [facility] None  10/17/2023  9:30 AM [facility] PROVIDER [facility] None    For next visit:  continue with routine prenatal care     Forestine Igo, CNM  07/30/2509:11 AM

## 2023-07-31 NOTE — Assessment & Plan Note (Signed)
 Reviewed kick counts and preterm labor warning signs. Instructed to call office or come to hospital with persistent headache, vision changes, regular contractions, leaking of fluid, decreased fetal movement or vaginal bleeding.

## 2023-07-31 NOTE — Patient Instructions (Signed)
 Gestational Diabetes Mellitus, Diagnosis Gestational diabetes mellitus, or gestational diabetes, is diabetes that some people get when pregnant. This condition usually happens at 24-28 weeks of pregnancy.  People with gestational diabetes have high blood sugar. If you do not get treated for this condition, it may cause problems for you and your baby. It goes away after you give birth but can happen again the next time you become pregnant. What are the causes? This condition is caused by changes in your body when you're pregnant. When these changes happen: The pancreas may not make enough insulin. The body may not use insulin in the right way. This is called insulin resistance. Insulin helps your body use sugar for energy. If your body doesn't have enough insulin or can't use the insulin that it has, extra sugars stay in your blood. This leads to high blood sugar (hyperglycemia). What increases the risk? Being older than age 31 when pregnant. Too much body weight. Having polycystic ovary syndrome (PCOS). Having someone with diabetes in your family. Having had this condition in the past. Being pregnant with more than one baby. What are the signs or symptoms? You may not have any symptoms. If you do have symptoms, they may include: Being thirsty often. Being hungry often. Needing to pee more often. These symptoms can be missed because they're similar to other symptoms of pregnancy. How is this diagnosed? This condition may be diagnosed based on your blood sugar level and how your body responds to glucose. This may be checked with an oral glucose tolerance test (OGTT). The test may be done: Early in pregnancy if you have risk factors. At 24-28 weeks of your pregnancy. How is this treated? To treat this condition, you may be told to: Eat a healthy diet. Get more exercise. Check your blood sugar often. Your health care provider will tell you what your target is. Take insulin and other  medicines. These are taken if needed. Work with a diabetes expert. Follow these instructions at home: Learn about your diabetes Ask your provider: How often should I check my blood sugar? Where do I get the equipment? What medicines do I need? When should I take them? Do I need to meet with an educator? Who can I call if I have questions? Where can I find a support group? General instructions Take medicines only as told by your provider. Stay at a healthy weight. Drink enough fluid to keep your pee (urine) pale yellow. Check your pee for ketones when sick and as told. Ketones in your pee is a sign that your body is using fat for energy because it's not making enough insulin. Wear an alert bracelet or carry a card that shows you have this condition. Keep all follow-up visits. Your provider needs to check your health and your baby's growth. Where to find more information Gestational Diabetes: American Diabetes Association (ADA): diabetes.org Gestational Diabetes and Pregnancy: Centers for Disease Control and Prevention (CDC): TonerPromos.no American Pregnancy Association: americanpregnancy.org [place] MyPlate: WrestlingReporter.dk Contact a health care provider if:  Your blood sugar is above your target for two tests in a row. You have a high blood sugar level and you also have ketones in your pee. You have been sick or have had a fever for 2 days or more and aren't getting better. You have any of these problems for more than 6 hours: You can't eat or drink. You vomit or feel like you may vomit. You have watery poop (diarrhea). Get help right away if:  You become confused or cannot think clearly. You have trouble breathing. You have chest pain. Your baby is moving less than usual. You have unusual discharge or bleeding from your vagina. You have cramping in your belly or have pain in your hips or lower back. You have symptoms of high blood pressure or preeclampsia. These include: A severe,  throbbing headache that doesn't go away. Sudden or extreme swelling of your face, hands, legs, or feet. Vision problems, such as: Seeing spots. Blurry vision. Sensitivity to light. These symptoms may be an emergency. Get help right away. Call 911. Do not wait to see if the symptoms will go away. Do not drive yourself to the hospital. This information is not intended to replace advice given to you by your health care provider. Make sure you discuss any questions you have with your health care provider. Document Revised: 11/15/2022 Document Reviewed: 05/25/2022 Elsevier Patient Education  2024 [employer]. Third Trimester of Pregnancy  The third trimester of pregnancy is from week 28 through week 40. This is months 7 through 9. The third trimester is a time when your baby is growing fast. Body changes during your third trimester Your body continues to change during this time. The changes usually go away after your baby is born. Physical changes You will continue to gain weight. You may get stretch marks on your hips, belly, and breasts. Your breasts will keep growing and may hurt. A yellow fluid (colostrum) may leak from your breasts. This is the first milk you're making for your baby. Your hair may grow faster and get thicker. In some cases, you may get hair loss. Your belly button may stick out. You may have more swelling in your hands, face, or ankles. Health changes You may have heartburn. You may feel short of breath. This is caused by the uterus that is now bigger. You may have more aches in the pelvis, back, or thighs. You may have more tingling or numbness in your hands, arms, and legs. You may pee more often. You may have trouble pooping (constipation) or swollen veins in the butt that can itch or get painful (hemorrhoids). Other changes You may have more problems sleeping. You may notice the baby moving lower in your belly (dropping). You may have more fluid coming from  your vagina. Your joints may feel loose, and you may have pain around your pelvic bone. Follow these instructions at home: Medicines Take medicines only as told by your health care provider. Some medicines are not safe during pregnancy. Your provider may change the medicines that you take. Do not take any medicines unless told to by your provider. Take a prenatal vitamin that has at least 600 micrograms (mcg) of folic acid. Do not use herbal medicines, illegal drugs, or medicines that are not approved by your provider. Eating and drinking While you're pregnant your body needs additional nutrition to help support your growing baby. Talk with your provider about your nutritional needs. Activity Most women are able to exercise regularly during pregnancy. Exercise routines may need to change at the end of your pregnancy. Talk to your provider about your activities and exercise routine. Relieving pain and discomfort Rest often with your legs raised if you have leg cramps or low back pain. Take warm sitz baths to soothe pain from hemorrhoids. Use hemorrhoid cream if your provider says it's okay. Wear a good, supportive bra if your breasts hurt. Do not use hot tubs, steam rooms, or saunas. Do not douche. Do  not use tampons or scented pads. Safety Talk to your provider before traveling far distances. Wear your seatbelt at all times when you're in a car. Talk to your provider if someone hits you, hurts you, or yells at you. Preparing for birth To prepare for your baby: Take childbirth and breastfeeding classes. Visit the hospital and tour the maternity area. Buy a rear-facing car seat. Learn how to install it in your car. General instructions Avoid cat litter boxes and soil used by cats. These things carry germs that can cause harm to your pregnancy and your baby. Do not drink alcohol, smoke, vape, or use products with nicotine or tobacco in them. If you need help quitting, talk with your  provider. Keep all follow-up visits for your third trimester. Your provider will do more exams and tests during this trimester. Write down your questions. Take them to your prenatal visits. Your provider also will: Talk with you about your overall health. Give you advice or refer you to specialists who can help with different needs, including: Mental health and counseling. Foods and healthy eating. Ask for help if you need help with food. Where to find more information American Pregnancy Association: americanpregnancy.org [employer] of Obstetricians and Gynecologists: acog.org Office on [employer] Health: TravelLesson.ca Contact a health care provider if: You have a headache that does not go away when you take medicine. You have any of these problems: You can't eat or drink. You have nausea and vomiting. You have watery poop (diarrhea) for 2 days or more. You have pain when you pee, or your pee smells bad. You have been sick for 2 days or more and aren't getting better. Contact your provider right away if: You have any of these coming from your vagina: Abnormal discharge. Bad-smelling fluid. Bleeding. Your baby is moving less than usual. You have signs of labor: You have any contractions, belly cramping, or have pain in your pelvis or lower back before 37 weeks of pregnancy (preterm labor). You have regular contractions that are less than 5 minutes apart. Your water breaks. You have symptoms of high blood pressure or preeclampsia. These include: A severe, throbbing headache that does not go away. Sudden or extreme swelling of your face, hands, legs, or feet. Vision problems: You see spots. You have blurry vision. Your eyes are sensitive to light. If you can't reach your provider, go to an urgent care or emergency room. Get help right away if: You faint, become confused, or can't think clearly. You have chest pain or trouble breathing. You have any kind of injury, such as  from a fall or a car crash. These symptoms may be an emergency. Call 911 right away. Do not wait to see if the symptoms will go away. Do not drive yourself to the hospital. This information is not intended to replace advice given to you by your health care provider. Make sure you discuss any questions you have with your health care provider. Document Revised: 11/15/2022 Document Reviewed: 06/15/2022 Elsevier Patient Education  2024 [employer].

## 2023-07-31 NOTE — Assessment & Plan Note (Signed)
 Steroids completed.

## 2023-07-31 NOTE — Progress Notes (Signed)
 MFM Consult Note  Chelsea Glenn is currently at 32 weeks and 2 days.  She was seen due to recently diagnosed diet-controlled gestational diabetes.    The patient has only been monitoring her fingerstick values for the past 2 to 3 days.  Her fingerstick values have been mostly within normal limits.  She had a cell free DNA test earlier in her pregnancy which indicated a low risk for trisomy 11, 36, and 13. A female fetus is predicted.   On today's exam, the overall EFW of 5 pounds 4 ounces measures at the 93rd percentile for her gestational age.    There was normal amniotic fluid noted with a total AFI of 10.89 cm.  The views of the fetal anatomy were limited today due to her advanced gestational age and the fetal position.  What was visualized today appeared within normal limits.  The patient was informed that anomalies may be missed due to technical limitations. If the fetus is in a suboptimal position or maternal habitus is increased, visualization of the fetus in the maternal uterus may be impaired.  The following were discussed during our consultation today:  Gestational diabetes and pregnancy  The implications and management of diabetes in pregnancy was discussed in detail with the patient.    She was advised to continue to monitor her fingersticks 4 times daily (fasting and 2 hours after each meal).    She was advised that our goals for her fingerstick values are fasting values of 90-95 or less and two-hour postprandial values of 120 or less.    Should the majority of her fingerstick results (greater than 50%) be above these values, she may have to be started on insulin or metformin to help her achieve better glycemic control.   The patient was advised that getting her fingerstick values as close to these goals as possible would provide her with the most optimal obstetrical outcome.  The patient was advised that delivery for well-controlled diabetes in pregnancy is usually  recommended at around 39 weeks.    A follow-up growth scan was scheduled in our office in 5 weeks.    She should start weekly fetal testing prior to her next growth scan should she require insulin or metformin for treatment.    The patient stated that all of her questions were answered today.  A total of 30 minutes was spent counseling and coordinating the care for this patient.  Greater than 50% of the time was spent in direct face-to-face contact.

## 2023-08-08 ENCOUNTER — Ambulatory Visit

## 2023-08-14 ENCOUNTER — Encounter: Payer: Self-pay | Admitting: Certified Nurse Midwife

## 2023-08-14 ENCOUNTER — Ambulatory Visit (INDEPENDENT_AMBULATORY_CARE_PROVIDER_SITE_OTHER): Admitting: Certified Nurse Midwife

## 2023-08-14 VITALS — BP 116/73 | HR 90 | Wt 172.1 lb

## 2023-08-14 DIAGNOSIS — Z3A34 34 weeks gestation of pregnancy: Secondary | ICD-10-CM

## 2023-08-14 DIAGNOSIS — Z3480 Encounter for supervision of other normal pregnancy, unspecified trimester: Secondary | ICD-10-CM

## 2023-08-14 DIAGNOSIS — O24410 Gestational diabetes mellitus in pregnancy, diet controlled: Secondary | ICD-10-CM | POA: Diagnosis not present

## 2023-08-14 NOTE — Patient Instructions (Signed)
 Birth/Newborn/Lactation at [facility]  [email or web address]  Copy and paste these links for a free child birth class on Youtube  [email or web address]

## 2023-08-14 NOTE — Assessment & Plan Note (Signed)
 Discussed positions for labor including ways we can move during pushing with an epidural. Encouraged watching youtube video to prep for birth. Links added to check out instructions. Will begin to create a birth wish list.  Reviewed kick counts and preterm labor warning signs. Instructed to call office or come to hospital with persistent headache, vision changes, regular contractions, leaking of fluid, decreased fetal movement or vaginal bleeding.

## 2023-08-14 NOTE — Progress Notes (Signed)
    Return Prenatal Note   Subjective   23 y.o. G2P0010 at [redacted]w[redacted]d presents for this follow-up prenatal visit.  Patient is doing well today, has no new concerns. She continues to check her blood sugar and has a log with her today. Patient reports: Movement: Present Contractions: Not present  Objective   Flow sheet Vitals: Pulse Rate: 90 BP: 116/73 Fundal Height: 34 cm Fetal Heart Rate (bpm): 140 Total weight gain: 27 lb 1.6 oz (12.3 kg)  General Appearance  No acute distress, well appearing, and well nourished Pulmonary   Normal work of breathing Neurologic   Alert and oriented to person, place, and time Psychiatric   Mood and affect within normal limits  Assessment/Plan   Plan  23 y.o. G2P0010 at [redacted]w[redacted]d presents for follow-up OB visit. Reviewed prenatal record including previous visit note.  Gestational diabetes 6/19 values elevated including 2 elevated fasting values. Discussed some diet choices. Fastings were elevated on the mornings where she had ice cream before bed. Reviewed some alternative snack choices and recommended adding in walks after dinner if possible. Has not scheduled with nutrition yet. Encourage that appointment to further help with diet modifications.   Supervision of other normal pregnancy, antepartum Discussed positions for labor including ways we can move during pushing with an epidural. Encouraged watching youtube video to prep for birth. Links added to check out instructions. Will begin to create a birth wish list.  Reviewed kick counts and preterm labor warning signs. Instructed to call office or come to hospital with persistent headache, vision changes, regular contractions, leaking of fluid, decreased fetal movement or vaginal bleeding.       No orders of the defined types were placed in this encounter.  No follow-ups on file.   Future Appointments  Date Time Provider Department Center  08/27/2023  9:55 AM Forestine Igo, CNM [facility] None   09/04/2023 11:00 AM [facility]    For next visit:  continue with routine prenatal care     Donato Fu, CNM [facility]

## 2023-08-14 NOTE — Assessment & Plan Note (Signed)
 6/19 values elevated including 2 elevated fasting values. Discussed some diet choices. Fastings were elevated on the mornings where she had ice cream before bed. Reviewed some alternative snack choices and recommended adding in walks after dinner if possible. Has not scheduled with nutrition yet. Encourage that appointment to further help with diet modifications.

## 2023-08-21 ENCOUNTER — Ambulatory Visit

## 2023-08-22 ENCOUNTER — Ambulatory Visit

## 2023-08-27 ENCOUNTER — Other Ambulatory Visit (HOSPITAL_COMMUNITY)
Admission: RE | Admit: 2023-08-27 | Discharge: 2023-08-27 | Disposition: A | Discharge status: Home / Self Care | Source: Ambulatory Visit | Attending: Certified Nurse Midwife | Admitting: Certified Nurse Midwife

## 2023-08-27 ENCOUNTER — Ambulatory Visit (INDEPENDENT_AMBULATORY_CARE_PROVIDER_SITE_OTHER): Admitting: Certified Nurse Midwife

## 2023-08-27 VITALS — BP 101/70 | HR 102 | Wt 172.1 lb

## 2023-08-27 DIAGNOSIS — Z113 Encounter for screening for infections with a predominantly sexual mode of transmission: Secondary | ICD-10-CM | POA: Insufficient documentation

## 2023-08-27 DIAGNOSIS — Z3A36 36 weeks gestation of pregnancy: Secondary | ICD-10-CM

## 2023-08-27 DIAGNOSIS — Z3480 Encounter for supervision of other normal pregnancy, unspecified trimester: Secondary | ICD-10-CM

## 2023-08-27 DIAGNOSIS — Z87440 Personal history of urinary (tract) infections: Secondary | ICD-10-CM

## 2023-08-27 DIAGNOSIS — O24410 Gestational diabetes mellitus in pregnancy, diet controlled: Secondary | ICD-10-CM | POA: Diagnosis not present

## 2023-08-27 DIAGNOSIS — Z3685 Encounter for antenatal screening for Streptococcus B: Secondary | ICD-10-CM

## 2023-08-27 LAB — POCT URINALYSIS DIPSTICK OB
Bilirubin, UA: NEGATIVE
Blood, UA: POSITIVE
Glucose, UA: NEGATIVE
Ketones, UA: NEGATIVE
Nitrite, UA: NEGATIVE
Spec Grav, UA: <=1.005 — AB (ref 1.010–1.025)
Urobilinogen, UA: 0.2 U/dL
pH, UA: 7 (ref 5.0–8.0)

## 2023-08-27 NOTE — Patient Instructions (Signed)
 Third Trimester of Pregnancy  The third trimester of pregnancy is from week 28 through week 40. This is months 7 through 9. The third trimester is a time when your baby is growing fast. Body changes during your third trimester Your body continues to change during this time. The changes usually go away after your baby is born. Physical changes You will continue to gain weight. You may get stretch marks on your hips, belly, and breasts. Your breasts will keep growing and may hurt. A yellow fluid (colostrum) may leak from your breasts. This is the first milk you're making for your baby. Your hair may grow faster and get thicker. In some cases, you may get hair loss. Your belly button may stick out. You may have more swelling in your hands, face, or ankles. Health changes You may have heartburn. You may feel short of breath. This is caused by the uterus that is now bigger. You may have more aches in the pelvis, back, or thighs. You may have more tingling or numbness in your hands, arms, and legs. You may pee more often. You may have trouble pooping (constipation) or swollen veins in the butt that can itch or get painful (hemorrhoids). Other changes You may have more problems sleeping. You may notice the baby moving lower in your belly (dropping). You may have more fluid coming from your vagina. Your joints may feel loose, and you may have pain around your pelvic bone. Follow these instructions at home: Medicines Take medicines only as told by your health care provider. Some medicines are not safe during pregnancy. Your provider may change the medicines that you take. Do not take any medicines unless told to by your provider. Take a prenatal vitamin that has at least 600 micrograms (mcg) of folic acid. Do not use herbal medicines, illegal drugs, or medicines that are not approved by your provider. Eating and drinking While you're pregnant your body needs additional nutrition to help  support your growing baby. Talk with your provider about your nutritional needs. Activity Most women are able to exercise regularly during pregnancy. Exercise routines may need to change at the end of your pregnancy. Talk to your provider about your activities and exercise routine. Relieving pain and discomfort Rest often with your legs raised if you have leg cramps or low back pain. Take warm sitz baths to soothe pain from hemorrhoids. Use hemorrhoid cream if your provider says it's okay. Wear a good, supportive bra if your breasts hurt. Do not use hot tubs, steam rooms, or saunas. Do not douche. Do not use tampons or scented pads. Safety Talk to your provider before traveling far distances. Wear your seatbelt at all times when you're in a car. Talk to your provider if someone hits you, hurts you, or yells at you. Preparing for birth To prepare for your baby: Take childbirth and breastfeeding classes. Visit the hospital and tour the maternity area. Buy a rear-facing car seat. Learn how to install it in your car. General instructions Avoid cat litter boxes and soil used by cats. These things carry germs that can cause harm to your pregnancy and your baby. Do not drink alcohol, smoke, vape, or use products with nicotine or tobacco in them. If you need help quitting, talk with your provider. Keep all follow-up visits for your third trimester. Your provider will do more exams and tests during this trimester. Write down your questions. Take them to your prenatal visits. Your provider also will: Talk with you about  your overall health. Give you advice or refer you to specialists who can help with different needs, including: Mental health and counseling. Foods and healthy eating. Ask for help if you need help with food. Where to find more information American Pregnancy Association: americanpregnancy.org [employer]: acog.org Office on [employer] Health:  [employer] Contact a health care provider if: You have a headache that does not go away when you take medicine. You have any of these problems: You can't eat or drink. You have nausea and vomiting. You have watery poop (diarrhea) for 2 days or more. You have pain when you pee, or your pee smells bad. You have been sick for 2 days or more and aren't getting better. Contact your provider right away if: You have any of these coming from your vagina: Abnormal discharge. Bad-smelling fluid. Bleeding. Your baby is moving less than usual. You have signs of labor: You have any contractions, belly cramping, or have pain in your pelvis or lower back before 37 weeks of pregnancy (preterm labor). You have regular contractions that are less than 5 minutes apart. Your water breaks. You have symptoms of high blood pressure or preeclampsia. These include: A severe, throbbing headache that does not go away. Sudden or extreme swelling of your face, hands, legs, or feet. Vision problems: You see spots. You have blurry vision. Your eyes are sensitive to light. If you can't reach your provider, go to an urgent care or emergency room. Get help right away if: You faint, become confused, or can't think clearly. You have chest pain or trouble breathing. You have any kind of injury, such as from a fall or a car crash. These symptoms may be an emergency. Call 911 right away. Do not wait to see if the symptoms will go away. Do not drive yourself to the hospital. This information is not intended to replace advice given to you by your health care provider. Make sure you discuss any questions you have with your health care provider. Document Revised: 11/15/2022 Document Reviewed: 06/15/2022 Elsevier Patient Education  2024 [employer] Inc[employer] Group B Streptococcus Infection During Pregnancy Group B Streptococcus (GBS) is a type of bacteria that is often found in healthy people. It is commonly found in the  rectum, vagina, and intestines. In people who are healthy and not pregnant, the bacteria rarely cause serious illness or complications. However, women who test positive for GBS during pregnancy can pass the bacteria to the baby during childbirth. This can cause serious infection in the baby after birth. Women with GBS may also have infections during their pregnancy or soon after childbirth. The infections include urinary tract infections (UTIs) or infections of the uterus. GBS also increases a woman's risk of complications during pregnancy, such as early labor or delivery, miscarriage, or stillbirth. Routine testing for GBS is recommended for all pregnant women. What are the causes? This condition is caused by bacteria called Streptococcus agalactiae. What increases the risk? You may have a higher risk for GBS infection during pregnancy if you had one during a past pregnancy. What are the signs or symptoms? In most cases, GBS infection does not cause symptoms in pregnant women. If symptoms exist, they may include: Labor that starts before the 37th week of pregnancy. A UTI or bladder infection. This may cause a fever, frequent urination, or pain and burning during urination. Fever during labor. There can also be a rapid heartbeat in the mother or baby. Rare but serious symptoms of a GBS  infection in women include: Blood infection (septicemia). This may cause fever, chills, or confusion. Lung infection (pneumonia). This may cause fever, chills, cough, rapid breathing, chest pain, or difficulty breathing. Bone, joint, skin, or soft tissue infection. How is this diagnosed? You may be screened for GBS between week 35 and week 37 of pregnancy. If you have symptoms of preterm labor, you may be screened earlier. This condition is diagnosed based on lab test results from: A swab of fluid from the vagina and rectum. A urine sample. How is this treated? This condition is treated with antibiotic medicine.  Antibiotic medicine may be given: To you when you go into labor, or as soon as your water breaks. The medicines will continue until after you give birth. If you are having a cesarean delivery, you do not need antibiotics unless your water has broken. To your baby, if he or she requires treatment. Your health care provider will check your baby to decide if he or she needs antibiotics to prevent a serious infection. Follow these instructions at home: Take over-the-counter and prescription medicines only as told by your health care provider. Take your antibiotic medicine as told by your health care provider. Do not stop taking the antibiotic even if you start to feel better. Keep all pre-birth (prenatal) visits and follow-up visits as told by your health care provider. This is important. Contact a health care provider if: You have pain or burning when you urinate. You have to urinate more often than usual. You have a fever or chills. You develop a bad-smelling vaginal discharge. Get help right away if: Your water breaks. You go into labor. You have severe pain in your abdomen. You have difficulty breathing. You have chest pain. These symptoms may represent a serious problem that is an emergency. Do not wait to see if the symptoms will go away. Get medical help right away. Call your local emergency services (911 in the [place] Do not drive yourself to the hospital. Summary GBS is a type of bacteria that is common in healthy people. During pregnancy, colonization with GBS can cause serious complications for you or your baby. Your health care provider will screen you between 35 and 37 weeks of pregnancy to determine if you are colonized with GBS. If you are colonized with GBS during pregnancy, your health care provider will recommend antibiotics through an IV during labor. After delivery, your baby will be evaluated for complications related to potential GBS infection and may require antibiotics to  prevent a serious infection. This information is not intended to replace advice given to you by your health care provider. Make sure you discuss any questions you have with your health care provider. Document Revised: 01/29/2022 Document Reviewed: 01/29/2022 Elsevier Patient Education  2024 [employer].

## 2023-08-27 NOTE — Assessment & Plan Note (Signed)
 Reviewed kick counts and preterm labor warning signs. Instructed to call office or come to hospital with persistent headache, vision changes, regular contractions, leaking of fluid, decreased fetal movement or vaginal bleeding. 36 week labs today.

## 2023-08-27 NOTE — Assessment & Plan Note (Signed)
 1/8 fastings elevated at 95. Reviewed that >50% of postprandial are elevated, however difficult to determine given uncertainty of timing of values after meals. Discussed starting metformin today, Chelsea Glenn reluctant to start-desires to closely monitor sugars with documented time of after meal value, if continue to be greater than 120 2h after meal OR >140 1h after meal would recommend initiating metformin. Chelsea Glenn will message via Mychart with results.

## 2023-08-27 NOTE — Progress Notes (Signed)
    Return Prenatal Note   Subjective   23 y.o. G2P0010 at [redacted]w[redacted]d presents for this follow-up prenatal visit.  Patient feeling well, active baby. Unsure if after meal blood sugars are 1 or 2h, has increased activity, avoiding high carb meals and sweets.  Patient reports: Movement: Present Contractions: Not present   Date Fasting Breakfast Lunch Dinner  6/22 87  143 127  6/23  90   129  144  6/25 88   130 140  6/26 87   143   6/27 80  125 124  6/28 95  125 112  6/30 90   127  142  7/1 87     Highest /lowest fasting: 95/80 2 hour post prandial Highest/lowest Breakfast: Highest lowest Lunch: 143/125 Highest/lowest Dinner:144/112 Objective   Flow sheet Vitals: Pulse Rate: (!) 102 BP: 101/70 Fundal Height: 36 cm Fetal Heart Rate (bpm): 140 Total weight gain: 27 lb 1.6 oz (12.3 kg)  General Appearance  No acute distress, well appearing, and well nourished Pulmonary   Normal work of breathing Neurologic   Alert and oriented to person, place, and time Psychiatric   Mood and affect within normal limits  Assessment/Plan   Plan  23 y.o. G2P0010 at [redacted]w[redacted]d presents for follow-up OB visit. Reviewed prenatal record including previous visit note.  Supervision of other normal pregnancy, antepartum Reviewed kick counts and preterm labor warning signs. Instructed to call office or come to hospital with persistent headache, vision changes, regular contractions, leaking of fluid, decreased fetal movement or vaginal bleeding. 36 week labs today.  Gestational diabetes 1/8 fastings elevated at 95. Reviewed that >50% of postprandial are elevated, however difficult to determine given uncertainty of timing of values after meals. Discussed starting metformin today, Ellenora reluctant to start-desires to closely monitor sugars with documented time of after meal value, if continue to be greater than 120 2h after meal OR >140 1h after meal would recommend initiating metformin. Darlyne will message  via Mychart with results.      Orders Placed This Encounter  Procedures   Strep Gp B Culture+Rflx   Urine Culture   POC Urinalysis Dipstick OB   Return in 1 week (on 09/03/2023) for ROB.   Future Appointments  Date Time Provider Department Center  09/04/2023 11:00 AM [facility]  09/05/2023  4:15 PM Jayne Harlene CROME, CNM [facility] None    For next visit:  continue with routine prenatal care     Harlene CROME Jayne, CNM  07/01/251:07 PM

## 2023-08-28 LAB — CERVICOVAGINAL ANCILLARY ONLY
Chlamydia: NEGATIVE
Comment: NEGATIVE
Comment: NORMAL
Neisseria Gonorrhea: NEGATIVE

## 2023-08-31 LAB — STREP GP B CULTURE+RFLX: Strep Gp B Culture+Rflx: NEGATIVE

## 2023-09-02 LAB — URINE CULTURE

## 2023-09-04 ENCOUNTER — Other Ambulatory Visit: Payer: Self-pay | Admitting: Obstetrics

## 2023-09-04 ENCOUNTER — Other Ambulatory Visit: Payer: Self-pay

## 2023-09-04 ENCOUNTER — Ambulatory Visit: Payer: Self-pay | Admitting: Certified Nurse Midwife

## 2023-09-04 ENCOUNTER — Ambulatory Visit (HOSPITAL_BASED_OUTPATIENT_CLINIC_OR_DEPARTMENT_OTHER): Admitting: Maternal & Fetal Medicine

## 2023-09-04 ENCOUNTER — Ambulatory Visit: Attending: Maternal & Fetal Medicine

## 2023-09-04 VITALS — BP 108/73 | HR 97

## 2023-09-04 DIAGNOSIS — O468X3 Other antepartum hemorrhage, third trimester: Secondary | ICD-10-CM

## 2023-09-04 DIAGNOSIS — O24410 Gestational diabetes mellitus in pregnancy, diet controlled: Secondary | ICD-10-CM | POA: Diagnosis present

## 2023-09-04 DIAGNOSIS — O3663X0 Maternal care for excessive fetal growth, third trimester, not applicable or unspecified: Secondary | ICD-10-CM | POA: Insufficient documentation

## 2023-09-04 DIAGNOSIS — O99891 Other specified diseases and conditions complicating pregnancy: Secondary | ICD-10-CM | POA: Diagnosis not present

## 2023-09-04 DIAGNOSIS — Z3A37 37 weeks gestation of pregnancy: Secondary | ICD-10-CM | POA: Insufficient documentation

## 2023-09-04 DIAGNOSIS — L52 Erythema nodosum: Secondary | ICD-10-CM | POA: Diagnosis not present

## 2023-09-04 NOTE — Progress Notes (Signed)
 MFM Consultation  [name] is a 23 yo G2P0 who is seen at 37w2 day  for follow up growth given A1GDM. However, she recently had a PNV where [name] CNM recommended Metformin given her abnormal ranges in her glucose log, however [name] declined at this time.   [name] notes that she is being very disciplined however her values are becoming more difficult to manage.  A single intrauterine pregnancy is observed today Normal interval growth with measurements consistent with large for gestational age.  Good fetal movement and amniotic fluid volume  Suboptimal views of the fetal anatomy was obtained secondary to fetal position and advanced gestation. Biophysical profile is 8/8 which was performed given LGA and requirement for medical therapy.   Given new LGA with AC > 90% and abnormal glucose logs. I recommend weekly testing with plan for delivery between 37 -39 weeks.   I spent 30 minutes with > 50% in face to face consultation.  Nathanel Chelsea Fetters, MD

## 2023-09-05 ENCOUNTER — Ambulatory Visit

## 2023-09-05 ENCOUNTER — Ambulatory Visit: Admitting: Certified Nurse Midwife

## 2023-09-05 VITALS — BP 106/76 | HR 104 | Wt 175.4 lb

## 2023-09-05 DIAGNOSIS — Z3480 Encounter for supervision of other normal pregnancy, unspecified trimester: Secondary | ICD-10-CM

## 2023-09-05 DIAGNOSIS — O24410 Gestational diabetes mellitus in pregnancy, diet controlled: Secondary | ICD-10-CM

## 2023-09-05 DIAGNOSIS — Z3A37 37 weeks gestation of pregnancy: Secondary | ICD-10-CM

## 2023-09-05 NOTE — Assessment & Plan Note (Signed)
 Labor & fetal movement precautions reviewed. Membranes swept today given MFM recommendation for delivery 37-39w due to EFW & suboptimal GDM control. Scheduled 7/19 8am arrival. May return for ROB & sweep early next week. MFM BPP scheduled.

## 2023-09-05 NOTE — Progress Notes (Signed)
    Return Prenatal Note   Subjective   23 y.o. G2P0010 at [redacted]w[redacted]d presents for this follow-up prenatal visit.  Patient here with partner, his daughter, her mom & younger brother. Saw MFM yesterday, they recommend starting Metformin, she prefers not to given birth is very soon. Delivery timing per MFM 37-39w given EFW >99%, 4084g. Checking blood sugar 3 times daily, usually only eating twice, all now 2h postprandial. Patient reports: Movement: Present Contractions: Irritability Date Fasting Breakfast Lunch Dinner  7/1 87     7/2    111     7/4 95 128   113  7/5 82 120   102  7/7 90 122    7/8  115    7/9 89 122    110  7/10 93 112    Highest /lowest fasting: 95/87 2 hour post prandial Highest/lowest Breakfast: 128/111 Highest lowest Lunch: n/a Highest/lowest Dinner: 113/102 Objective   Flow sheet Vitals: Pulse Rate: (!) 104 BP: 106/76 Fundal Height: 38 cm Fetal Heart Rate (bpm): 145 Presentation: Vertex Dilation: 2 (swept) Effacement (%): 50 Station: -2 Total weight gain: 30 lb 6.4 oz (13.8 kg)  General Appearance  No acute distress, well appearing, and well nourished Pulmonary   Normal work of breathing Neurologic   Alert and oriented to person, place, and time Psychiatric   Mood and affect within normal limits  Assessment/Plan   Plan  23 y.o. G2P0010 at [redacted]w[redacted]d presents for follow-up OB visit. Reviewed prenatal record including previous visit note.  Supervision of other normal pregnancy, antepartum Labor & fetal movement precautions reviewed. Membranes swept today given MFM recommendation for delivery 37-39w due to EFW & suboptimal GDM control. Scheduled 7/19 8am arrival. May return for ROB & sweep early next week. MFM BPP scheduled.  Gestational diabetes 1/6 fasting elevated. 4/10 postprandial. Discussed initiation of Metformin to assist with glucose control in last weeks of pregnancy, declines metformin today. Reviewed glucose monitoring in labor, optimal blood  glucose <120 prior to infant's birth to avoid hypoglycemia for baby as much as possible. Reviewed there is a chance of baby needing treatment for hypoglycemia. Reviewed increased risk of IUFD with poorly controlled GDM & LGA baby as well as recommendation for delivery by 39w.      No orders of the defined types were placed in this encounter.  Return in 1 week (on 09/12/2023) for ROB.   Future Appointments  Date Time Provider Department Center  09/11/2023  9:00 AM [facility] None  09/13/2023  2:35 PM Slaughterbeck, Damien, CNM [facility] None    For next visit:  MFM BPP & ROB with sweep     Harlene LITTIE Cisco, CNM  07/10/255:27 PM

## 2023-09-05 NOTE — Patient Instructions (Signed)
 Gestational Diabetes Mellitus, Diagnosis Gestational diabetes mellitus, or gestational diabetes, is diabetes that some people get when pregnant. This condition usually happens at 24-28 weeks of pregnancy.  People with gestational diabetes have high blood sugar. If you do not get treated for this condition, it may cause problems for you and your baby. It goes away after you give birth but can happen again the next time you become pregnant. What are the causes? This condition is caused by changes in your body when you're pregnant. When these changes happen: The pancreas may not make enough insulin. The body may not use insulin in the right way. This is called insulin resistance. Insulin helps your body use sugar for energy. If your body doesn't have enough insulin or can't use the insulin that it has, extra sugars stay in your blood. This leads to high blood sugar (hyperglycemia). What increases the risk? Being older than age 31 when pregnant. Too much body weight. Having polycystic ovary syndrome (PCOS). Having someone with diabetes in your family. Having had this condition in the past. Being pregnant with more than one baby. What are the signs or symptoms? You may not have any symptoms. If you do have symptoms, they may include: Being thirsty often. Being hungry often. Needing to pee more often. These symptoms can be missed because they're similar to other symptoms of pregnancy. How is this diagnosed? This condition may be diagnosed based on your blood sugar level and how your body responds to glucose. This may be checked with an oral glucose tolerance test (OGTT). The test may be done: Early in pregnancy if you have risk factors. At 24-28 weeks of your pregnancy. How is this treated? To treat this condition, you may be told to: Eat a healthy diet. Get more exercise. Check your blood sugar often. Your health care provider will tell you what your target is. Take insulin and other  medicines. These are taken if needed. Work with a diabetes expert. Follow these instructions at home: Learn about your diabetes Ask your provider: How often should I check my blood sugar? Where do I get the equipment? What medicines do I need? When should I take them? Do I need to meet with an educator? Who can I call if I have questions? Where can I find a support group? General instructions Take medicines only as told by your provider. Stay at a healthy weight. Drink enough fluid to keep your pee (urine) pale yellow. Check your pee for ketones when sick and as told. Ketones in your pee is a sign that your body is using fat for energy because it's not making enough insulin. Wear an alert bracelet or carry a card that shows you have this condition. Keep all follow-up visits. Your provider needs to check your health and your baby's growth. Where to find more information Gestational Diabetes: American Diabetes Association (ADA): diabetes.org Gestational Diabetes and Pregnancy: Centers for Disease Control and Prevention (CDC): TonerPromos.no American Pregnancy Association: americanpregnancy.org [place] MyPlate: WrestlingReporter.dk Contact a health care provider if:  Your blood sugar is above your target for two tests in a row. You have a high blood sugar level and you also have ketones in your pee. You have been sick or have had a fever for 2 days or more and aren't getting better. You have any of these problems for more than 6 hours: You can't eat or drink. You vomit or feel like you may vomit. You have watery poop (diarrhea). Get help right away if:  You become confused or cannot think clearly. You have trouble breathing. You have chest pain. Your baby is moving less than usual. You have unusual discharge or bleeding from your vagina. You have cramping in your belly or have pain in your hips or lower back. You have symptoms of high blood pressure or preeclampsia. These include: A severe,  throbbing headache that doesn't go away. Sudden or extreme swelling of your face, hands, legs, or feet. Vision problems, such as: Seeing spots. Blurry vision. Sensitivity to light. These symptoms may be an emergency. Get help right away. Call 911. Do not wait to see if the symptoms will go away. Do not drive yourself to the hospital. This information is not intended to replace advice given to you by your health care provider. Make sure you discuss any questions you have with your health care provider. Document Revised: 11/15/2022 Document Reviewed: 05/25/2022 Elsevier Patient Education  2024 [employer]. Third Trimester of Pregnancy  The third trimester of pregnancy is from week 28 through week 40. This is months 7 through 9. The third trimester is a time when your baby is growing fast. Body changes during your third trimester Your body continues to change during this time. The changes usually go away after your baby is born. Physical changes You will continue to gain weight. You may get stretch marks on your hips, belly, and breasts. Your breasts will keep growing and may hurt. A yellow fluid (colostrum) may leak from your breasts. This is the first milk you're making for your baby. Your hair may grow faster and get thicker. In some cases, you may get hair loss. Your belly button may stick out. You may have more swelling in your hands, face, or ankles. Health changes You may have heartburn. You may feel short of breath. This is caused by the uterus that is now bigger. You may have more aches in the pelvis, back, or thighs. You may have more tingling or numbness in your hands, arms, and legs. You may pee more often. You may have trouble pooping (constipation) or swollen veins in the butt that can itch or get painful (hemorrhoids). Other changes You may have more problems sleeping. You may notice the baby moving lower in your belly (dropping). You may have more fluid coming from  your vagina. Your joints may feel loose, and you may have pain around your pelvic bone. Follow these instructions at home: Medicines Take medicines only as told by your health care provider. Some medicines are not safe during pregnancy. Your provider may change the medicines that you take. Do not take any medicines unless told to by your provider. Take a prenatal vitamin that has at least 600 micrograms (mcg) of folic acid. Do not use herbal medicines, illegal drugs, or medicines that are not approved by your provider. Eating and drinking While you're pregnant your body needs additional nutrition to help support your growing baby. Talk with your provider about your nutritional needs. Activity Most women are able to exercise regularly during pregnancy. Exercise routines may need to change at the end of your pregnancy. Talk to your provider about your activities and exercise routine. Relieving pain and discomfort Rest often with your legs raised if you have leg cramps or low back pain. Take warm sitz baths to soothe pain from hemorrhoids. Use hemorrhoid cream if your provider says it's okay. Wear a good, supportive bra if your breasts hurt. Do not use hot tubs, steam rooms, or saunas. Do not douche. Do  not use tampons or scented pads. Safety Talk to your provider before traveling far distances. Wear your seatbelt at all times when you're in a car. Talk to your provider if someone hits you, hurts you, or yells at you. Preparing for birth To prepare for your baby: Take childbirth and breastfeeding classes. Visit the hospital and tour the maternity area. Buy a rear-facing car seat. Learn how to install it in your car. General instructions Avoid cat litter boxes and soil used by cats. These things carry germs that can cause harm to your pregnancy and your baby. Do not drink alcohol, smoke, vape, or use products with nicotine or tobacco in them. If you need help quitting, talk with your  provider. Keep all follow-up visits for your third trimester. Your provider will do more exams and tests during this trimester. Write down your questions. Take them to your prenatal visits. Your provider also will: Talk with you about your overall health. Give you advice or refer you to specialists who can help with different needs, including: Mental health and counseling. Foods and healthy eating. Ask for help if you need help with food. Where to find more information American Pregnancy Association: americanpregnancy.org [employer] of Obstetricians and Gynecologists: acog.org Office on [employer] Health: TravelLesson.ca Contact a health care provider if: You have a headache that does not go away when you take medicine. You have any of these problems: You can't eat or drink. You have nausea and vomiting. You have watery poop (diarrhea) for 2 days or more. You have pain when you pee, or your pee smells bad. You have been sick for 2 days or more and aren't getting better. Contact your provider right away if: You have any of these coming from your vagina: Abnormal discharge. Bad-smelling fluid. Bleeding. Your baby is moving less than usual. You have signs of labor: You have any contractions, belly cramping, or have pain in your pelvis or lower back before 37 weeks of pregnancy (preterm labor). You have regular contractions that are less than 5 minutes apart. Your water breaks. You have symptoms of high blood pressure or preeclampsia. These include: A severe, throbbing headache that does not go away. Sudden or extreme swelling of your face, hands, legs, or feet. Vision problems: You see spots. You have blurry vision. Your eyes are sensitive to light. If you can't reach your provider, go to an urgent care or emergency room. Get help right away if: You faint, become confused, or can't think clearly. You have chest pain or trouble breathing. You have any kind of injury, such as  from a fall or a car crash. These symptoms may be an emergency. Call 911 right away. Do not wait to see if the symptoms will go away. Do not drive yourself to the hospital. This information is not intended to replace advice given to you by your health care provider. Make sure you discuss any questions you have with your health care provider. Document Revised: 11/15/2022 Document Reviewed: 06/15/2022 Elsevier Patient Education  2024 [employer].

## 2023-09-05 NOTE — Assessment & Plan Note (Addendum)
 1/6 fasting elevated. 4/10 postprandial. Discussed initiation of Metformin to assist with glucose control in last weeks of pregnancy, declines metformin today. Reviewed glucose monitoring in labor, optimal blood glucose <120 prior to infant's birth to avoid hypoglycemia for baby as much as possible. Reviewed there is a chance of baby needing treatment for hypoglycemia. Reviewed increased risk of IUFD with poorly controlled GDM & LGA baby as well as recommendation for delivery by 39w.

## 2023-09-10 ENCOUNTER — Ambulatory Visit (INDEPENDENT_AMBULATORY_CARE_PROVIDER_SITE_OTHER): Admitting: Obstetrics

## 2023-09-10 ENCOUNTER — Encounter: Admitting: Obstetrics

## 2023-09-10 VITALS — BP 100/64 | HR 103 | Wt 173.0 lb

## 2023-09-10 DIAGNOSIS — Z3480 Encounter for supervision of other normal pregnancy, unspecified trimester: Secondary | ICD-10-CM

## 2023-09-10 DIAGNOSIS — O24410 Gestational diabetes mellitus in pregnancy, diet controlled: Secondary | ICD-10-CM | POA: Diagnosis not present

## 2023-09-10 DIAGNOSIS — A630 Anogenital (venereal) warts: Secondary | ICD-10-CM

## 2023-09-10 DIAGNOSIS — Z3A38 38 weeks gestation of pregnancy: Secondary | ICD-10-CM | POA: Diagnosis not present

## 2023-09-10 NOTE — Progress Notes (Signed)
    Return Prenatal Note   Assessment/Plan   Plan  23 y.o. G2P0010 at [redacted]w[redacted]d presents for follow-up OB visit. Reviewed prenatal record including previous visit note.  Supervision of other normal pregnancy, antepartum - Membrane sweep preformed and tolerated well, reviewed cramping and contractions may result. Discussed when to report to the hospital for labor.  -Reviewed labor warning signs. Instructed to call office or come to hospital with persistent headache, vision changes, regular contractions, leaking of fluid, decreased fetal movement or vaginal bleeding.    Gestational diabetes -Log provided today, discussed continuing to make healthy dietary choices leading up to her induction.     No orders of the defined types were placed in this encounter.  No follow-ups on file.   Future Appointments  Date Time Provider Department Center  09/11/2023  9:00 AM [facility] None    For next visit:  IOL scheduled for 7/19    Subjective  Chelsea Glenn is feeling well overall, she is ready for baby! She has been having cramping and contractions since yesterday after she went curb walking. She requests a cervical exam and membrane sweep today.   Movement: Present Contractions: Not present  Objective   Flow sheet Vitals: Pulse Rate: (!) 103 BP: 100/64 Fundal Height: 40 cm Fetal Heart Rate (bpm): 152 Dilation: 2 Effacement (%): 70 Station: -2 Total weight gain: 12.7 kg  General Appearance  No acute distress, well appearing, and well nourished Pulmonary   Normal work of breathing Neurologic   Alert and oriented to person, place, and time Psychiatric   Mood and affect within normal limits  Chelsea Glenn, [facility] 09/10/23 10:51 AM

## 2023-09-10 NOTE — Progress Notes (Signed)
 Date Fasting Breakfast Lunch Dinner  7/2 111     7/4  95  128     7/5 82 120  113   7/7 90 122  102   7/8 115     7/9 89 122 110   7/10 93 112      Highest /lowest fasting:90/111 2 hour post prandial Highest/lowest Breakfast:112/128 Highest lowest Lunch:112/128 Highest/lowest Dinner: 102/113

## 2023-09-10 NOTE — Assessment & Plan Note (Signed)
-   Membrane sweep preformed and tolerated well, reviewed cramping and contractions may result. Discussed when to report to the hospital for labor.  -Reviewed labor warning signs. Instructed to call office or come to hospital with persistent headache, vision changes, regular contractions, leaking of fluid, decreased fetal movement or vaginal bleeding.

## 2023-09-10 NOTE — Assessment & Plan Note (Signed)
-  Log provided today, discussed continuing to make healthy dietary choices leading up to her induction.

## 2023-09-11 ENCOUNTER — Ambulatory Visit: Attending: Obstetrics and Gynecology

## 2023-09-11 DIAGNOSIS — Z3A38 38 weeks gestation of pregnancy: Secondary | ICD-10-CM

## 2023-09-11 DIAGNOSIS — O24410 Gestational diabetes mellitus in pregnancy, diet controlled: Secondary | ICD-10-CM | POA: Diagnosis not present

## 2023-09-11 NOTE — Addendum Note (Signed)
 Addended by: JAYNE RAISIN on: 09/11/2023 08:47 PM   Modules accepted: Orders

## 2023-09-11 NOTE — Procedures (Signed)
 Chelsea Glenn 2000-03-14 [redacted]w[redacted]d  Fetus A Non-Stress Test Interpretation for 09/11/23  Indication: Gestational Diabetes - Diet controlled - NST only  Fetal Heart Rate A Mode: External Baseline Rate (A): 130 bpm Variability: Moderate Accelerations: 15 x 15 Decelerations: None Multiple birth?: No  Uterine Activity Mode: Palpation, Toco Contraction Frequency (min): None noted Resting Tone Palpated: Relaxed  Interpretation (Fetal Testing) Nonstress Test Interpretation: Reactive Comments: Reviewed with Dr. Arna

## 2023-09-13 ENCOUNTER — Encounter: Admitting: Certified Nurse Midwife

## 2023-09-14 ENCOUNTER — Inpatient Hospital Stay
Admission: RE | Admit: 2023-09-14 | Discharge: 2023-09-16 | DRG: 807 | Disposition: A | Discharge status: Home / Self Care | Attending: Certified Nurse Midwife | Admitting: Certified Nurse Midwife

## 2023-09-14 ENCOUNTER — Inpatient Hospital Stay: Admitting: General Practice

## 2023-09-14 ENCOUNTER — Other Ambulatory Visit: Payer: Self-pay

## 2023-09-14 ENCOUNTER — Encounter: Payer: Self-pay | Admitting: Certified Nurse Midwife

## 2023-09-14 DIAGNOSIS — O24410 Gestational diabetes mellitus in pregnancy, diet controlled: Secondary | ICD-10-CM

## 2023-09-14 DIAGNOSIS — O24419 Gestational diabetes mellitus in pregnancy, unspecified control: Secondary | ICD-10-CM | POA: Diagnosis present

## 2023-09-14 DIAGNOSIS — Z3A38 38 weeks gestation of pregnancy: Secondary | ICD-10-CM | POA: Diagnosis not present

## 2023-09-14 DIAGNOSIS — O9972 Diseases of the skin and subcutaneous tissue complicating childbirth: Secondary | ICD-10-CM | POA: Diagnosis present

## 2023-09-14 DIAGNOSIS — Z87891 Personal history of nicotine dependence: Secondary | ICD-10-CM

## 2023-09-14 DIAGNOSIS — Z833 Family history of diabetes mellitus: Secondary | ICD-10-CM | POA: Diagnosis not present

## 2023-09-14 DIAGNOSIS — O24425 Gestational diabetes mellitus in childbirth, controlled by oral hypoglycemic drugs: Principal | ICD-10-CM | POA: Diagnosis present

## 2023-09-14 DIAGNOSIS — R21 Rash and other nonspecific skin eruption: Secondary | ICD-10-CM | POA: Diagnosis present

## 2023-09-14 DIAGNOSIS — L52 Erythema nodosum: Secondary | ICD-10-CM | POA: Diagnosis present

## 2023-09-14 DIAGNOSIS — Z3480 Encounter for supervision of other normal pregnancy, unspecified trimester: Secondary | ICD-10-CM

## 2023-09-14 DIAGNOSIS — O468X3 Other antepartum hemorrhage, third trimester: Principal | ICD-10-CM

## 2023-09-14 LAB — GLUCOSE, CAPILLARY
Glucose-Capillary: 163 mg/dL — ABNORMAL HIGH (ref 70–99)
Glucose-Capillary: 90 mg/dL (ref 70–99)
Glucose-Capillary: 75 mg/dL (ref 70–99)
Glucose-Capillary: 97 mg/dL (ref 70–99)

## 2023-09-14 LAB — TYPE AND SCREEN
ABO/RH(D): O POS
Antibody Screen: NEGATIVE

## 2023-09-14 LAB — CBC
HCT: 32.4 % — ABNORMAL LOW (ref 36.0–46.0)
Hemoglobin: 10.4 g/dL — ABNORMAL LOW (ref 12.0–15.0)
MCH: 24.4 pg — ABNORMAL LOW (ref 26.0–34.0)
MCHC: 32.1 g/dL (ref 30.0–36.0)
MCV: 76.1 fL — ABNORMAL LOW (ref 80.0–100.0)
Platelets: 347 K/uL (ref 150–400)
RBC: 4.26 MIL/uL (ref 3.87–5.11)
RDW: 16 % — ABNORMAL HIGH (ref 11.5–15.5)
WBC: 7.4 K/uL (ref 4.0–10.5)
nRBC: 0 % (ref 0.0–0.2)

## 2023-09-14 LAB — ABO/RH: ABO/RH(D): O POS

## 2023-09-14 MED ORDER — OXYTOCIN BOLUS FROM INFUSION
333.0000 mL | Freq: Once | INTRAVENOUS | Status: AC
  Administered 2023-09-15: 333 mL via INTRAVENOUS

## 2023-09-14 MED ORDER — OXYTOCIN-SODIUM CHLORIDE 30-0.9 UT/500ML-% IV SOLN
1.0000 m[IU]/min | INTRAVENOUS | Status: DC
  Administered 2023-09-14: 2 m[IU]/min via INTRAVENOUS

## 2023-09-14 MED ORDER — ACETAMINOPHEN 325 MG PO TABS: 650.0000 mg | ORAL_TABLET | ORAL | Status: DC | PRN

## 2023-09-14 MED ORDER — FENTANYL CITRATE (PF) 100 MCG/2ML IJ SOLN
50.0000 ug | INTRAMUSCULAR | Status: DC | PRN
  Administered 2023-09-14: 100 ug via INTRAVENOUS
  Administered 2023-09-14: 50 ug via INTRAVENOUS
  Filled 2023-09-14 (×2): qty 2

## 2023-09-14 MED ORDER — PHENYLEPHRINE 80 MCG/ML (10ML) SYRINGE FOR IV PUSH (FOR BLOOD PRESSURE SUPPORT): 80.0000 ug | PREFILLED_SYRINGE | INTRAVENOUS | Status: DC | PRN

## 2023-09-14 MED ORDER — LIDOCAINE-EPINEPHRINE (PF) 1.5 %-1:200000 IJ SOLN
INTRAMUSCULAR | Status: DC | PRN
Start: 2023-09-14 — End: 2023-09-15
  Administered 2023-09-14: 3 mL via PERINEURAL

## 2023-09-14 MED ORDER — MISOPROSTOL 50MCG HALF TABLET: 50.0000 ug | ORAL_TABLET | Freq: Once | ORAL | Status: DC

## 2023-09-14 MED ORDER — LACTATED RINGERS IV SOLN
500.0000 mL | Freq: Once | INTRAVENOUS | Status: DC
  Administered 2023-09-14: 500 mL via INTRAVENOUS

## 2023-09-14 MED ORDER — DIPHENHYDRAMINE HCL 50 MG/ML IJ SOLN: 12.5000 mg | INTRAMUSCULAR | Status: DC | PRN

## 2023-09-14 MED ORDER — AMMONIA AROMATIC IN INHA
RESPIRATORY_TRACT | Status: AC
  Filled 2023-09-14: qty 10

## 2023-09-14 MED ORDER — FENTANYL-BUPIVACAINE-NACL 0.5-0.125-0.9 MG/250ML-% EP SOLN
12.0000 mL/h | EPIDURAL | Status: DC | PRN
  Administered 2023-09-14: 12 mL/h via EPIDURAL

## 2023-09-14 MED ORDER — LIDOCAINE HCL (PF) 1 % IJ SOLN
INTRAMUSCULAR | Status: AC
  Filled 2023-09-14: qty 30

## 2023-09-14 MED ORDER — ONDANSETRON HCL 4 MG/2ML IJ SOLN: 4.0000 mg | Freq: Four times a day (QID) | INTRAMUSCULAR | Status: DC | PRN

## 2023-09-14 MED ORDER — PHENYLEPHRINE 80 MCG/ML (10ML) SYRINGE FOR IV PUSH (FOR BLOOD PRESSURE SUPPORT)
80.0000 ug | PREFILLED_SYRINGE | INTRAVENOUS | Status: DC | PRN
  Administered 2023-09-14 (×2): 80 ug via INTRAVENOUS
  Filled 2023-09-14: qty 10

## 2023-09-14 MED ORDER — TERBUTALINE SULFATE 1 MG/ML IJ SOLN: 0.2500 mg | Freq: Once | INTRAMUSCULAR | Status: DC | PRN

## 2023-09-14 MED ORDER — HYDROXYZINE HCL 25 MG PO TABS: 50.0000 mg | ORAL_TABLET | Freq: Four times a day (QID) | ORAL | Status: DC | PRN

## 2023-09-14 MED ORDER — LACTATED RINGERS IV SOLN: INTRAVENOUS | Status: DC

## 2023-09-14 MED ORDER — FENTANYL-BUPIVACAINE-NACL 0.5-0.125-0.9 MG/250ML-% EP SOLN
EPIDURAL | Status: AC
  Filled 2023-09-14: qty 250

## 2023-09-14 MED ORDER — LACTATED RINGERS IV SOLN
500.0000 mL | INTRAVENOUS | Status: DC | PRN
  Administered 2023-09-14 (×2): 1000 mL via INTRAVENOUS

## 2023-09-14 MED ORDER — MISOPROSTOL 200 MCG PO TABS
ORAL_TABLET | ORAL | Status: AC
  Filled 2023-09-14: qty 4

## 2023-09-14 MED ORDER — EPHEDRINE 5 MG/ML INJ: 10.0000 mg | INTRAVENOUS | Status: DC | PRN

## 2023-09-14 MED ORDER — MISOPROSTOL 50MCG HALF TABLET: 50.0000 ug | ORAL_TABLET | ORAL | Status: DC | PRN

## 2023-09-14 MED ORDER — MISOPROSTOL 25 MCG QUARTER TABLET: 25.0000 ug | ORAL_TABLET | Freq: Once | ORAL | Status: DC

## 2023-09-14 MED ORDER — SOD CITRATE-CITRIC ACID 500-334 MG/5ML PO SOLN: 30.0000 mL | ORAL | Status: DC | PRN

## 2023-09-14 MED ORDER — OXYTOCIN-SODIUM CHLORIDE 30-0.9 UT/500ML-% IV SOLN
2.5000 [IU]/h | INTRAVENOUS | Status: DC
  Filled 2023-09-14: qty 500

## 2023-09-14 MED ORDER — SODIUM CHLORIDE 0.9 % IV SOLN
INTRAVENOUS | Status: DC | PRN
  Administered 2023-09-14 (×2): 5 mL via EPIDURAL

## 2023-09-14 MED ORDER — LIDOCAINE HCL (PF) 1 % IJ SOLN: 30.0000 mL | INTRAMUSCULAR | Status: DC | PRN

## 2023-09-14 MED ORDER — OXYTOCIN 10 UNIT/ML IJ SOLN
INTRAMUSCULAR | Status: AC
  Filled 2023-09-14: qty 2

## 2023-09-14 NOTE — Progress Notes (Signed)
 Labor Progress Note   ASSESSMENT/PLAN   Chelsea Glenn 23 y.o.   G2P0010  at [redacted]w[redacted]d here with induction of labor.  FWB:  - Fetal well being assessed: Cat 2 - recurrent variables with contractions     GBS: - GBS Negative/-- (07/01 1012)    LABOR: - Now in active labor, doing well. - Pain Management: epidural in place - Pitocin  was turned from 14 mU/min to 8 mU/min, IV bolus given, patient turned and hypotension treated with some improvement. Dr. Lovetta updated on strip and patient status. Will continue to monitor.  - Anticipate SVD   Active Problems:  A2GDM -CBG at  2126 was 97  Labor Progress:  0920  2/60/-2, pitocin  started 1330  3/80/-2 1430  3/80/-2, AR[facility] clear 2119   7/90/0   SUBJECTIVE/OBJECTIVE   SUBJECTIVE: Patient comfortable with epidural     OBJECTIVE:  Last SVE:  Dilation: 7 Effacement (%): 90 Station: 0 Presentation: Vertex Exam by:: Lavel Rieman, CNM -  , Rupture Date: 09/14/23, Rupture Time: 1615,    FHR:   - Mode: External  - Baseline Rate (A): 140 bpm  -  moderate varibility  - Characteristics (ie - accels, decels): Accelerations: 15 x 15, recurrent variables   UTERINE ACTIVITY:   - Mode: Toco  - Contraction Frequency (min): 1-5 minutes Pitocin : 8 mU/min
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
## 2023-09-14 NOTE — Inpatient Diabetes Management (Signed)
 Inpatient Diabetes Program Recommendations  ADA Standards of Care 2025 Diabetes in Pregnancy Target Glucose Ranges:  Fasting: 70 - 95 mg/dL 1 hr postprandial:  889 - 140mg /dL (from first bite of meal) 2 hr postprandial:  100 - 120 mg/dL (from first bit of meal)    Lab Results  Component Value Date   GLUCAP 163 (H) 09/14/2023   HGBA1C 5.8 (H) 07/11/2023    Review of Glycemic Control  Latest Reference Range & Units 09/14/23 08:36  Glucose-Capillary 70 - 99 mg/dL 836 (H)   Diabetes history: GDM Outpatient Diabetes medications:  None Current orders for Inpatient glycemic control:  CBG's ordered q 4 hours  Inpatient Diabetes Program Recommendations:    Consider IV insulin/EndoTool, if CBG's>120 mg/dL while in labor.   Thanks,  Randall Bullocks, RN, BC-ADM Inpatient Diabetes Coordinator Pager [phone]  (8a-5p)
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
## 2023-09-14 NOTE — Anesthesia Procedure Notes (Signed)
 Epidural Patient location during procedure: OB Start time: 09/14/2023 8:09 PM End time: 09/14/2023 8:13 PM  Staffing Anesthesiologist: Leavy Ned, MD Performed: anesthesiologist   Preanesthetic Checklist Completed: patient identified, IV checked, site marked, risks and benefits discussed, surgical consent, monitors and equipment checked, pre-op evaluation and timeout performed  Epidural Patient position: sitting Prep: Betadine Patient monitoring: heart rate, continuous pulse ox and blood pressure Approach: midline Location: L4-L5 Injection technique: LOR saline  Needle:  Needle type: Tuohy  Needle gauge: 18 G Needle length: 9 cm and 9 Needle insertion depth: 5 cm Catheter type: closed end flexible Catheter size: 20 Guage Catheter at skin depth: 10 cm Test dose: negative and 1.5% lidocaine  with Epi 1:200 K  Assessment Sensory level: T4 Events: blood not aspirated, no cerebrospinal fluid, injection not painful, no injection resistance, no paresthesia and negative IV test  Additional Notes 1 attempt Pt. Evaluated and documentation done after procedure finished. Patient identified. Risks/Benefits/Options discussed with patient including but not limited to bleeding, infection, nerve damage, paralysis, failed block, incomplete pain control, headache, blood pressure changes, nausea, vomiting, reactions to medication both or allergic, itching and postpartum back pain. Confirmed with bedside nurse the patient's most recent platelet count. Confirmed with patient that they are not currently taking any anticoagulation, have any bleeding history or any family history of bleeding disorders. Patient expressed understanding and wished to proceed. All questions were answered. Sterile technique was used throughout the entire procedure. Please see nursing notes for vital signs. Test dose was given through epidural catheter and negative prior to continuing to dose epidural or start infusion.  Warning signs of high block given to the patient including shortness of breath, tingling/numbness in hands, complete motor block, or any concerning symptoms with instructions to call for help. Patient was given instructions on fall risk and not to get out of bed. All questions and concerns addressed with instructions to call with any issues or inadequate analgesia.    Patient tolerated the insertion well without immediate complications. Reason for block:procedure for pain

## 2023-09-14 NOTE — Progress Notes (Signed)
 Labor Progress Note   ASSESSMENT/PLAN   Chelsea Glenn 22 y.o.   G2P0010  at [redacted]w[redacted]d here with induction of labor.  FWB:  - Fetal well being assessed: Cat 1      GBS: - GBS Negative/-- (07/01 1012)    LABOR: - Now in early labor, doing well. - Pain Management: coping well with partner support - Discussed options with patient and offered AROM. Reviewed risks and benefits. Chelsea Glenn will consider the option. Will recheck in 4 hours.  - Anticipate SVD   Active Problems:  A2GDM- -last CBG was 90  Labor Progress:  0920  2/60/-2, pitocin  started 1330  3/80/-2  SUBJECTIVE/OBJECTIVE   SUBJECTIVE: Feeling some increased discomfort with contractions    OBJECTIVE: Vital Signs: Patient Vitals for the past 12 hrs:  BP Temp Temp src Pulse Resp Height Weight  09/14/23 1232 (!) 108/52 98.6 F (37 C) Oral 79 16 -- --  09/14/23 0828 126/81 98.4 F (36.9 C) Oral (!) 116 16 5' 3 (1.6 m) 79.4 kg    Last SVE:  Dilation: 3 Effacement (%): 80 Station: -2 Presentation: Vertex Exam by:: Pablo Mathurin, CNM   FHR:   - Mode: External  - Baseline Rate (A): 120 bpm  -  moderate varibility  - Characteristics (ie - accels, decels): Accelerations: 15 x 15, no decelerations   UTERINE ACTIVITY:   - Mode: Toco  - Contraction Frequency (min): 2-5 minutes Pitocin : 14 mU/min

## 2023-09-14 NOTE — Progress Notes (Signed)
 Labor Progress Note   ASSESSMENT/PLAN   Chelsea Glenn 23 y.o.   G2P0010  at [redacted]w[redacted]d here with induction of labor.  FWB:  - Fetal well being assessed: Cat 1      GBS: - GBS Negative/-- (07/01 1012)    LABOR: - Continues in early labor - Pain Management: coping well with family support - Discussed options with patient and she would like proceed with AROM.   - Anticipate SVD    Labor Progress: 0920  2/60/-2, pitocin  started 1330  3/80/-2 1430  3/80/-2, AROM clear  SUBJECTIVE/OBJECTIVE   SUBJECTIVE: Feeling stronger contractions    OBJECTIVE: Vital Signs: Patient Vitals for the past 12 hrs:  BP Temp Temp src Pulse Resp Height Weight  09/14/23 1600 111/60 98 F (36.7 C) Oral 72 16 -- --  09/14/23 1232 (!) 108/52 98.6 F (37 C) Oral 79 16 -- --  09/14/23 0828 126/81 98.4 F (36.9 C) Oral (!) 116 16 5' 3 (1.6 m) 79.4 kg    Last SVE:  Dilation: 3 Effacement (%): 80 Station: -2 Presentation: Vertex Exam by:: Barton Want, CNM -  , Rupture Date: 09/14/23, Rupture Time: 1615,    FHR:   - Mode: External  - Baseline Rate (A): 120 bpm  -  moderate varibility  - Characteristics (ie - accels, decels): Accelerations: 15 x 15, no decels   UTERINE ACTIVITY:   - Mode: Toco  - Contraction Frequency (min): 2-4 minutes Pitocin : 14 mU/min

## 2023-09-15 ENCOUNTER — Encounter: Payer: Self-pay | Admitting: Obstetrics and Gynecology

## 2023-09-15 DIAGNOSIS — O9972 Diseases of the skin and subcutaneous tissue complicating childbirth: Secondary | ICD-10-CM

## 2023-09-15 DIAGNOSIS — O24419 Gestational diabetes mellitus in pregnancy, unspecified control: Secondary | ICD-10-CM

## 2023-09-15 DIAGNOSIS — L52 Erythema nodosum: Secondary | ICD-10-CM

## 2023-09-15 DIAGNOSIS — Z3A38 38 weeks gestation of pregnancy: Secondary | ICD-10-CM

## 2023-09-15 LAB — GLUCOSE, RANDOM: Glucose, Bld: 127 mg/dL — ABNORMAL HIGH (ref 70–99)

## 2023-09-15 LAB — RPR: RPR Ser Ql: NONREACTIVE

## 2023-09-15 MED ORDER — OXYTOCIN-SODIUM CHLORIDE 30-0.9 UT/500ML-% IV SOLN: 2.5000 [IU]/h | INTRAVENOUS | Status: DC | PRN

## 2023-09-15 MED ORDER — TETANUS-DIPHTH-ACELL PERTUSSIS 5-2.5-18.5 LF-MCG/0.5 IM SUSY
0.5000 mL | PREFILLED_SYRINGE | Freq: Once | INTRAMUSCULAR | Status: DC
  Filled 2023-09-15: qty 0.5

## 2023-09-15 MED ORDER — PRENATAL MULTIVITAMIN CH
1.0000 | ORAL_TABLET | Freq: Every day | ORAL | Status: DC
  Administered 2023-09-15: 1 via ORAL
  Filled 2023-09-15 (×2): qty 1

## 2023-09-15 MED ORDER — ZOLPIDEM TARTRATE 5 MG PO TABS: 5.0000 mg | ORAL_TABLET | Freq: Every evening | ORAL | Status: DC | PRN

## 2023-09-15 MED ORDER — DOCUSATE SODIUM 100 MG PO CAPS
100.0000 mg | ORAL_CAPSULE | Freq: Two times a day (BID) | ORAL | Status: DC
  Administered 2023-09-15: 100 mg via ORAL
  Filled 2023-09-15: qty 1

## 2023-09-15 MED ORDER — AMMONIA AROMATIC IN INHA
RESPIRATORY_TRACT | Status: AC
  Filled 2023-09-15: qty 10

## 2023-09-15 MED ORDER — OXYCODONE-ACETAMINOPHEN 5-325 MG PO TABS: 1.0000 | ORAL_TABLET | ORAL | Status: DC | PRN

## 2023-09-15 MED ORDER — ACETAMINOPHEN 325 MG PO TABS
650.0000 mg | ORAL_TABLET | ORAL | Status: DC | PRN
  Administered 2023-09-15 – 2023-09-16 (×3): 650 mg via ORAL
  Filled 2023-09-15 (×4): qty 2

## 2023-09-15 MED ORDER — DIPHENHYDRAMINE HCL 25 MG PO CAPS: 25.0000 mg | ORAL_CAPSULE | Freq: Four times a day (QID) | ORAL | Status: DC | PRN

## 2023-09-15 MED ORDER — IBUPROFEN 600 MG PO TABS
600.0000 mg | ORAL_TABLET | Freq: Four times a day (QID) | ORAL | Status: DC
  Administered 2023-09-15 – 2023-09-16 (×5): 600 mg via ORAL
  Filled 2023-09-15 (×5): qty 1

## 2023-09-15 MED ORDER — SIMETHICONE 80 MG PO CHEW
80.0000 mg | CHEWABLE_TABLET | ORAL | Status: DC | PRN
Start: 2023-09-15 — End: 2023-09-16

## 2023-09-15 MED ORDER — BENZOCAINE-MENTHOL 20-0.5 % EX AERO
1.0000 | INHALATION_SPRAY | CUTANEOUS | Status: DC | PRN
  Administered 2023-09-15: 1 via TOPICAL
  Filled 2023-09-15: qty 56

## 2023-09-15 MED ORDER — ONDANSETRON HCL 4 MG/2ML IJ SOLN
INTRAMUSCULAR | Status: AC
  Administered 2023-09-15: 4 mg
  Filled 2023-09-15: qty 2
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
## 2023-09-15 NOTE — Discharge Summary (Signed)
 Patient Name: Chelsea Glenn DOB: January 09, 2001 MRN: [id number]                            Discharge Summary  Date of Admission: 09/14/2023 Date of Discharge: 09/16/2023 Delivering Provider: MACY PERKINS  Admitting Diagnosis: Gestational diabetes mellitus (GDM) affecting pregnancy [O24.419] at [redacted]w[redacted]d Secondary diagnosis:  Principal Problem:   Gestational diabetes mellitus (GDM) affecting pregnancy   Mode of Delivery: normal spontaneous vaginal delivery              Discharge diagnosis: Term Pregnancy Delivered and GDM A2      Intrapartum Procedures: Atificial rupture of membranes, epidural, and laceration periuretheral   Post partum procedures: none  Complications: shoulder dystocia                      Discharge Day SOAP Note:  Progress Note - Vaginal Delivery  Chelsea Glenn is a 23 y.o. G2P1011 now PP day 1 s/p Vaginal, Spontaneous. Delivery was complicated by shoulder dystocia  Subjective  The patient has the following complaints: has no unusual complaints  Pain is controlled with current medications.   Patient is urinating without difficulty.  She is ambulating well.     Objective  Vital signs: BP 100/67 (BP Location: Left Arm)   Pulse 73   Temp 98.1 F (36.7 C) (Oral)   Resp 18   Ht 5' 3 (1.6 m)   Wt 79.4 kg   LMP 12/17/2022   SpO2 100%   Breastfeeding Unknown   BMI 31.00 kg/m   Physical Exam: Gen: NAD Fundus Fundal Tone: Firm  Lochia Amount: Small        Data Review Labs: Lab Results  Component Value Date   WBC 7.4 09/14/2023   HGB 10.4 (L) 09/14/2023   HCT 32.4 (L) 09/14/2023   MCV 76.1 (L) 09/14/2023   PLT 347 09/14/2023      Latest Ref Rng & Units 09/14/2023    8:41 AM 07/03/2023   10:42 AM 03/11/2023   10:13 AM  CBC  WBC 4.0 - 10.5 K/uL 7.4  9.0  8.6   Hemoglobin 12.0 - 15.0 g/dL 89.5  89.4  87.0   Hematocrit 36.0 - 46.0 % 32.4  34.2  40.0   Platelets 150 - 400 K/uL 347  454  381    O POS Performed at  [facility], [address] [place], [place] [address]   Van Score:    09/15/2023    6:08 PM  Van Postnatal Depression Scale Screening Tool  I have been able to laugh and see the funny side of things. 0  I have looked forward with enjoyment to things. 0  I have blamed myself unnecessarily when things went wrong. 2  I have been anxious or worried for no good reason. 2  I have felt scared or panicky for no good reason. 0  Things have been getting on top of me. 1  I have been so unhappy that I have had difficulty sleeping. 0  I have felt sad or miserable. 0  I have been so unhappy that I have been crying. 1  The thought of harming myself has occurred to me. 0  Edinburgh Postnatal Depression Scale Total 6    Assessment/Plan  Principal Problem:   Gestational diabetes mellitus (GDM) affecting pregnancy    Plan for discharge today.  Discharge Instructions: Per After Visit Summary. Activity:  Advance as tolerated. Pelvic rest for 6 weeks.  Also refer to After Visit Summary Diet: Regular Medications: Allergies as of 09/16/2023       Reactions   Penicillins Anaphylaxis   Coconut Fatty Acid Other (See Comments)   Skin gets red and throat starts burning        Medication List     STOP taking these medications    Accu-Chek Guide w/Device Kit   Accu-Chek Softclix Lancets lancets   glucose blood test strip       TAKE these medications    Prenatal Vitamins 27-0.8 MG Tabs Take 1 tablet by mouth daily.       Outpatient follow up:   Follow-up Information     Gloris Shiroma, Damien, CNM Follow up.   Specialty: Certified Nurse Midwife Why: Virtual visit for mood in 2 weeks Postpartum visit in office in 6 weeks. Contact information: [address] [place] [address] [phone]                Postpartum contraception: Plans IUD  Discharged Condition: good  Discharged to: home  Newborn Data: Disposition:home with  mother  Apgars: APGAR (1 MIN): 7  APGAR (5 MINS): 9  APGAR (10 MINS):    Baby Feeding: Breast

## 2023-09-16 DIAGNOSIS — L52 Erythema nodosum: Secondary | ICD-10-CM

## 2023-09-16 DIAGNOSIS — O24419 Gestational diabetes mellitus in pregnancy, unspecified control: Secondary | ICD-10-CM

## 2023-09-16 DIAGNOSIS — Z3A38 38 weeks gestation of pregnancy: Secondary | ICD-10-CM

## 2023-09-16 DIAGNOSIS — O9972 Diseases of the skin and subcutaneous tissue complicating childbirth: Secondary | ICD-10-CM

## 2023-09-16 LAB — VARICELLA ZOSTER ANTIBODY, IGG: Varicella IgG: REACTIVE

## 2023-09-16 NOTE — Anesthesia Postprocedure Evaluation (Signed)
 Anesthesia Post Note  Patient: Chelsea Glenn  Procedure(s) Performed: AN AD HOC LABOR EPIDURAL  Patient location during evaluation: Mother Baby Anesthesia Type: Epidural Level of consciousness: awake and alert and oriented Pain management: satisfactory to patient Vital Signs Assessment: post-procedure vital signs reviewed and stable Respiratory status: spontaneous breathing Cardiovascular status: blood pressure returned to baseline Postop Assessment: adequate PO intake, able to ambulate and no apparent nausea or vomiting Anesthetic complications: no Comments: Able to void.   No notable events documented.   Last Vitals:  Vitals:   09/15/23 1616 09/15/23 2331  BP: 109/68 120/73  Pulse: 90 90  Resp: 18 20  Temp: 36.8 C 36.8 C  SpO2: 97% 100%    Last Pain:  Vitals:   09/16/23 0605  TempSrc:   PainSc: 1                  Chelsea Glenn

## 2023-09-16 NOTE — Lactation Note (Signed)
 This note was copied from a baby's chart. Lactation Consultation Note  Patient Name: Boy Kadra Kohan Unijb'd Date: 09/16/2023 Age:25 hours Reason for consult: Follow-up assessment;Primapara;Early term 37-38.6wks;Other (Comment) (Discharge Education)   Maternal Data Follow up assessment and discharge education w/ parents and infant.  Mom stated that feedings are going good.  She did ask the RN overnight to help her again with latching because her nipples were sore.    Feeding Mother's Current Feeding Choice: Breast Milk  Interventions Interventions: Breast feeding basics reviewed;Education;CDC milk storage guidelines  LC reviewed the importance of feeding infant at least 8x w/in a 24hr period.  Patient the use of formula later on and asked what kind is the best.  LC informed mom to discuss the type of formula with her pediatrician.  Discharge Discharge Education: Engorgement and breast care;Outpatient recommendation;Warning signs for feeding baby  Education on engorgement prevention/treatment was discussed as well as breastmilk storage guidelines.  LC provided patient with a handout on breastmilk storage guidelines from [facility]. [facility] outpatient lactation services phone number written on the white board in the room.  Patient verbalized understanding.  LC also provided education from the postpartum book about warning signs to look for in a poor feeding.    Consult Status Consult Status: Complete Follow-up type: Call as needed    Ricky RAMAN Kayleeann Huxford 09/16/2023, 12:01 PM

## 2023-09-16 NOTE — Plan of Care (Signed)

## 2023-09-16 NOTE — Discharge Instructions (Signed)

## 2023-09-19 ENCOUNTER — Ambulatory Visit

## 2023-09-25 NOTE — Patient Instructions (Signed)

## 2023-09-25 NOTE — Progress Notes (Addendum)
   Virtual Visit via Video Note  I connected with Ms. Schadt on 10/21/23 at   9:35 AM EDT by a video enabled telemedicine application and verified that I am speaking with the correct person using two identifiers.  Location: Patient: Chelsea Glenn Provider: OFFICE   I discussed the limitations of evaluation and management by telemedicine and the availability of in person appointments. The patient expressed understanding and agreed to proceed.    History of Present Illness:   Chelsea Glenn is a 23 y.o. G22P1011 female who presents for a 2 week televisit for mood check. She is 2 weeks postpartum following a normal spontaneous vaginal delivery.  The delivery was at 38.6 gestational weeks.  Postpartum course has been well so far. Baby is feeding by breast and bottle, pumping as well. Bleeding: spotting. Postpartum depression screening: negative.  EDPS score is 3.      The following portions of the patient's history were reviewed and updated as appropriate: allergies, current medications, past family history, past medical history, past social history, past surgical history, and problem list.   Observations/Objective:   Last menstrual period 12/17/2022, unknown if currently breastfeeding. Gen App: NAD Psych: normal speech, affect. Good mood.        09/27/2023    9:42 AM 09/15/2023    6:08 PM 09/15/2023    7:25 AM 03/11/2023    9:10 AM  Edinburgh Postnatal Depression Scale Screening Tool  I have been able to laugh and see the funny side of things. 0 0 -- 0  I have looked forward with enjoyment to things. 0 0  0  I have blamed myself unnecessarily when things went wrong. 1 2  2   I have been anxious or worried for no good reason. 0 2  2  I have felt scared or panicky for no good reason. 0 0  1  Things have been getting on top of me. 1 1  2   I have been so unhappy that I have had difficulty sleeping. 1 0  1  I have felt sad or miserable. 0 0  0  I have been so unhappy that I have  been crying. 0 1  0  The thought of harming myself has occurred to me. 0 0  0  Edinburgh Postnatal Depression Scale Total 3 6  8       Data saved with a previous flowsheet row definition         Assessment and Plan:   1. Encounter for screening for maternal depression - Screening Negative today. Will rescreen at 6 week postpartum visit. Overall doing well.    2. Postpartum state - Overall doing well. Continue routine postpartum home care. Reviewed normal return of the pelvic floor muscles during the postpartum period   3. Lactating mother - Breastfeeding and pumping is going well.    Follow Up Instructions:     I discussed the assessment and treatment plan with the patient. The patient was provided an opportunity to ask questions and all were answered. The patient agreed with the plan and demonstrated an understanding of the instructions.   The patient was advised to call back or seek an in-person evaluation if the symptoms worsen or if the condition fails to improve as anticipated.   I provided 15 minutes of non-face-to-face time during this encounter.     Damien Parsley, CNM [facility]

## 2023-09-27 ENCOUNTER — Telehealth: Admitting: Certified Nurse Midwife

## 2023-09-27 ENCOUNTER — Encounter: Payer: Self-pay | Admitting: Certified Nurse Midwife

## 2023-10-03 ENCOUNTER — Ambulatory Visit

## 2023-10-17 ENCOUNTER — Ambulatory Visit

## 2023-10-21 NOTE — Patient Instructions (Signed)
IUD PLACEMENT POST-PROCEDURE INSTRUCTIONS  You may take Ibuprofen, Aleve or Tylenol for pain if needed.  Cramping should resolve within in 24 hours.  You may have a small amount of spotting.  You should wear a mini pad for the next few days.  You may have intercourse after 24 hours.  If you using this for birth control, it is effective immediately.  You need to call if you have any pelvic pain, fever, heavy bleeding or foul smelling vaginal discharge.  Irregular bleeding is common the first several months after having an IUD placed. You do not need to call for this reason unless you are concerned.  Shower or bathe as normal  You should have a follow-up appointment in 4-8 weeks for a re-check to make sure you are not having any problems.     WHAT IS THE FOURTH TRIMESTER?  The fourth trimester is the 12 weeks following the birth of a newborn. In these first few months of your baby's life, it's an important time to create a bond with them. It's also a period of adjustment as your baby adapts to life outside of the womb.  Why Is It Called the Fourth Trimester? The first trimester of pregnancy is from 1 to 14 weeks, the second trimester is from 14 to 28 weeks, and the third trimester is from 28 weeks until your baby is born. The fourth trimester includes the first weeks after you've given birth. This is the time when both you and your baby adjust to life after delivery.  The term fourth trimester was coined in 2002 by pediatrician Harvey Karp, MD. He claimed that you should try to recreate the kind of environment that your baby had while still in the womb. There are several ways that you can do this.  Skin-to-skin contact. To help recreate what your baby's life in the womb was like, you and your partner can share skin-to-skin contact with your baby. This way, your baby can feel your heartbeat and the warmth of your skin, which are both comforting and familiar to them. You can also get this  contact with your baby during breastfeeding.   Swaddling and moving. While in the womb, your baby was in a small, confined space. You can recreate this sense of safety and security for them by swaddling. Studies show that babies may sleep better when swaddled. You can also get this same effect by carrying your baby in a sling close to your body.  Movement is also very comforting for your baby. Since they are used to the movement of your body from being in the womb, movement during the fourth trimester for infants is familiar to them.   What to Expect in the Fourth Trimester The fourth trimester is an important time for newborns. Typically, they have their first pediatric checkup during the first week after they're born. This doctor visit is important to monitor their health and development. Your baby's pediatrician will continue to closely monitor their well-being throughout the following weeks.  In these first months after birth, your baby is just learning how to use their senses to process the world around them. They are totally dependent on you to care for them and to understand their needs. It's during this time that your baby will likely learn how to start doing some things independently, like:  Making noises to communicate Holding their head up without help Keeping their attention on objects and follow them Using their muscles Smiling  Your baby has   a lot of adjusting to do outside of the womb. Their brains are taking in new sensations like tastes, smells, and sounds. During this time, it's important to follow their cues when it comes to sleeping, crying, and feeding.  In the fourth trimester, babies need 14 to 17 hours of sleep each day, even though their sleep schedule won't be predictable. After the fourth trimester, your baby may start to sleep through the night. They will also need to be fed every 2 to 3 hours because their little body is growing so quickly.  When it comes to  crying, you can try swaddling or rocking your baby to create that familiar environment for them. Other times, your baby will cry because they are hungry, need a diaper change, or simply want to be held by you.  Tips for Mom For moms, you'll notice that the fourth trimester is a period of great change for you, too. Before delivery, the mother's health is monitored quite closely. After birth, the focus usually shifts from you to the health of your baby. But it's just as important that new mothers get good postpartum care, too.   Your body is adjusting from pregnancy to healing from birth. Some common side effects that you might feel from childbirth include:  Changes in hormones Swelling Postpartum bleeding A general feeling of discomfort  These effects, plus the lack of sleep, can cause you to feel stress or anxiety, or to have mood swings. It's important to talk to your doctor about how you feel after giving birth and to not be afraid to ask for help when you need it.  To help yourself stay well during the 4th trimester, make sure you:  Eat a nutritious, balanced diet. Try to get rest when your schedule and your baby's schedule allow. Check in with your doctor or midwife if you experience pain or feel unwell. Talk to your doctor if you feel like you might have symptoms of postpartum depression.  [email or web address]://[email or web address].com/baby/what-is-the-fourth-[email or web address]  

## 2023-10-21 NOTE — Progress Notes (Unsigned)
   OBSTETRICS POSTPARTUM CLINIC PROGRESS NOTE  Subjective:     Chelsea Glenn is a 23 y.o. G16P1011 female who presents for a postpartum visit. She is 6 weeks postpartum following a spontaneous vaginal delivery. I have fully reviewed the prenatal and intrapartum course. The delivery was at 38.6 gestational weeks.  Anesthesia: epidural. Postpartum course has been good. Baby's course has been going well. Baby is feeding by breast. Bleeding: patient has not resumed menses, with Patient's last menstrual period was 12/17/2022.[name] Bowel function is abnormal: she still has some constipation on and off, she takes a laxative occasionally . Bladder function is normal. Patient is not sexually active. Contraception method desired is IUD. Postpartum depression screening: negative.  EDPS score is 6.    The following portions of the patient's history were reviewed and updated as appropriate: allergies, current medications, past family history, past medical history, past social history, past surgical history, and problem list.  Review of Systems Pertinent items are noted in HPI.   Objective:    LMP 12/17/2022   General:  alert and no distress   Breasts:  inspection negative, no nipple discharge or bleeding, no masses or nodularity palpable  Lungs: clear to auscultation bilaterally  Heart:  regular rate and rhythm, S1, S2 normal, no murmur, click, rub or gallop  Abdomen: soft, non-tender; bowel sounds normal; no masses,  no organomegaly.    Vulva:  normal  Vagina: normal vagina, no discharge, exudate, lesion, or erythema  Cervix:  no cervical motion tenderness and no lesions  Corpus: normal size, contour, position, consistency, mobility, non-tender  Adnexa:  normal adnexa and no mass, fullness, tenderness  Rectal Exam: Not performed.         Labs:  Lab Results  Component Value Date   HGB 10.4 (L) 09/14/2023     Assessment:   1. Postpartum care following vaginal delivery   2. Encounter for  insertion of intrauterine contraceptive device (IUD)      Plan:    1. Contraception: IUD 2. Will check Hgb for h/o postpartum anemia of less than 10.  3. Follow up in: 1 year for annual or as needed.   IUD Insertion Procedure Note  Chelsea Glenn is a 23 y.o. G2P1011 here for Mirena  IUD insertion. No GYN concerns.  Last pap smear was on 12/13/2022 and was normal.  Patient identified, informed consent performed, consent signed.   Discussed risks of irregular bleeding, cramping, infection, malpositioning or misplacement of the IUD outside the uterus which may require further procedure such as laparoscopy. Also discussed >99% contraception efficacy, increased risk of ectopic pregnancy with failure of method.   Emphasized that this did not protect against STIs, condoms recommended during all sexual encounters. Time out was performed.  Urine pregnancy test negative.  Speculum placed in the vagina.  Cervix visualized.  Cleaned with Betadine x 2.  Grasped anteriorly with a single tooth tenaculum.  Uterus sounded to 3 cm.   IUD placed per manufacturer's recommendations.  Strings trimmed to 3 cm. Tenaculum was removed, good hemostasis noted.  Patient tolerated procedure well.   Patient was given post-procedure instructions.  She was advised to have backup contraception for one week.  Patient was also asked to check IUD strings periodically and follow up in 4 weeks for IUD check.    Damien Parsley, CNM [facility]

## 2023-10-22 ENCOUNTER — Ambulatory Visit (INDEPENDENT_AMBULATORY_CARE_PROVIDER_SITE_OTHER): Admitting: Certified Nurse Midwife

## 2023-10-22 DIAGNOSIS — Z30430 Encounter for insertion of intrauterine contraceptive device: Secondary | ICD-10-CM | POA: Diagnosis not present

## 2023-10-22 DIAGNOSIS — Z3202 Encounter for pregnancy test, result negative: Secondary | ICD-10-CM

## 2023-10-22 LAB — POCT URINE PREGNANCY: Preg Test, Ur: NEGATIVE

## 2023-10-22 MED ORDER — LEVONORGESTREL 20 MCG/DAY IU IUD
1.0000 | INTRAUTERINE_SYSTEM | Freq: Once | INTRAUTERINE | Status: AC
Start: 2023-10-22 — End: 2023-10-22
  Administered 2023-10-22: 1 via INTRAUTERINE

## 2023-11-25 ENCOUNTER — Emergency Department

## 2023-11-25 ENCOUNTER — Other Ambulatory Visit: Payer: Self-pay

## 2023-11-25 ENCOUNTER — Emergency Department
Admission: EM | Admit: 2023-11-25 | Discharge: 2023-11-25 | Disposition: A | Discharge status: Home / Self Care | Attending: Emergency Medicine | Admitting: Emergency Medicine

## 2023-11-25 DIAGNOSIS — T8332XA Displacement of intrauterine contraceptive device, initial encounter: Secondary | ICD-10-CM | POA: Insufficient documentation

## 2023-11-25 DIAGNOSIS — R1031 Right lower quadrant pain: Secondary | ICD-10-CM | POA: Insufficient documentation

## 2023-11-25 DIAGNOSIS — Y762 Prosthetic and other implants, materials and accessory obstetric and gynecological devices associated with adverse incidents: Secondary | ICD-10-CM | POA: Diagnosis not present

## 2023-11-25 DIAGNOSIS — R112 Nausea with vomiting, unspecified: Secondary | ICD-10-CM | POA: Diagnosis not present

## 2023-11-25 DIAGNOSIS — R197 Diarrhea, unspecified: Secondary | ICD-10-CM | POA: Diagnosis not present

## 2023-11-25 LAB — COMPREHENSIVE METABOLIC PANEL WITH GFR
ALT: 23 U/L (ref 0–44)
AST: 25 U/L (ref 15–41)
Albumin: 4.2 g/dL (ref 3.5–5.0)
Alkaline Phosphatase: 107 U/L (ref 38–126)
Anion gap: 12 (ref 5–15)
BUN: 13 mg/dL (ref 6–20)
CO2: 23 mmol/L (ref 22–32)
Calcium: 9.4 mg/dL (ref 8.9–10.3)
Chloride: 104 mmol/L (ref 98–111)
Creatinine, Ser: 0.67 mg/dL (ref 0.44–1.00)
GFR, Estimated: >60 mL/min (ref >60)
Glucose, Bld: 146 mg/dL — ABNORMAL HIGH (ref 70–99)
Potassium: 4.1 mmol/L (ref 3.5–5.1)
Sodium: 139 mmol/L (ref 135–145)
Total Bilirubin: 0.5 mg/dL (ref 0.0–1.2)
Total Protein: 8.3 g/dL — ABNORMAL HIGH (ref 6.5–8.1)

## 2023-11-25 LAB — CBC
HCT: 39.8 % (ref 36.0–46.0)
Hemoglobin: 12 g/dL (ref 12.0–15.0)
MCH: 23.6 pg — ABNORMAL LOW (ref 26.0–34.0)
MCHC: 30.2 g/dL (ref 30.0–36.0)
MCV: 78.3 fL — ABNORMAL LOW (ref 80.0–100.0)
Platelets: 511 K/uL — ABNORMAL HIGH (ref 150–400)
RBC: 5.08 MIL/uL (ref 3.87–5.11)
RDW: 17 % — ABNORMAL HIGH (ref 11.5–15.5)
WBC: 8.9 K/uL (ref 4.0–10.5)
nRBC: 0 % (ref 0.0–0.2)

## 2023-11-25 LAB — URINALYSIS, ROUTINE W REFLEX MICROSCOPIC
Bilirubin Urine: NEGATIVE
Glucose, UA: NEGATIVE mg/dL
Ketones, ur: NEGATIVE mg/dL
Leukocytes,Ua: NEGATIVE
Nitrite: NEGATIVE
Protein, ur: NEGATIVE mg/dL
Specific Gravity, Urine: 1.021 (ref 1.005–1.030)
pH: 6 (ref 5.0–8.0)

## 2023-11-25 LAB — LIPASE, BLOOD: Lipase: 21 U/L (ref 11–51)

## 2023-11-25 LAB — POC URINE PREG, ED: Preg Test, Ur: NEGATIVE

## 2023-11-25 MED ORDER — OXYCODONE-ACETAMINOPHEN 5-325 MG PO TABS: 1.0000 | ORAL_TABLET | ORAL | 0 refills | Status: AC | PRN

## 2023-11-25 MED ORDER — ONDANSETRON 4 MG PO TBDP: 4.0000 mg | ORAL_TABLET | Freq: Three times a day (TID) | ORAL | 0 refills | Status: AC | PRN

## 2023-11-25 MED ORDER — SODIUM CHLORIDE 0.9 % IV BOLUS
1000.0000 mL | Freq: Once | INTRAVENOUS | Status: AC
  Administered 2023-11-25: 1000 mL via INTRAVENOUS

## 2023-11-25 MED ORDER — KETOROLAC TROMETHAMINE 15 MG/ML IJ SOLN
15.0000 mg | Freq: Once | INTRAMUSCULAR | Status: AC
  Administered 2023-11-25: 15 mg via INTRAVENOUS
  Filled 2023-11-25: qty 1

## 2023-11-25 MED ORDER — IOHEXOL 300 MG/ML  SOLN
100.0000 mL | Freq: Once | INTRAMUSCULAR | Status: AC | PRN
  Administered 2023-11-25: 100 mL via INTRAVENOUS

## 2023-11-25 MED ORDER — ONDANSETRON HCL 4 MG/2ML IJ SOLN
4.0000 mg | Freq: Once | INTRAMUSCULAR | Status: AC
  Administered 2023-11-25: 4 mg via INTRAVENOUS
  Filled 2023-11-25: qty 2

## 2023-11-25 NOTE — Discharge Instructions (Signed)
 You are seen in the emergency department for abdominal pain.  You had a CT scan that showed that your IUD migrated outside of your uterus and is in your abdominal cavity.  You will need to have this removed surgically.  You can go to gynecology appointment tomorrow and see Dr. Starla at 10 AM.  If you are unable to make this appointment call their office to reschedule.  Return to the emergency department for any ongoing or worsening symptoms  Pain control:  Ibuprofen  (motrin /aleve/advil ) - You can take 3 tablets (600 mg) every 6 hours as needed for pain/fever.  Acetaminophen  (tylenol ) - You can take 2 extra strength tablets (1000 mg) every 6 hours as needed for pain/fever.  You can alternate these medications or take them together.  Make sure you eat food/drink water when taking these medications.  zofran  (ondansetron ) - nausea medication, take 1 tablet every 8 hours as needed for nausea/vomiting.  You were given a prescription for narcotic pain medications.  Take only if in severe pain.  These are very addictive medications.  These medications can make you constipated.  If you need to take more than 1-2 doses, start a stool softner.  If you become constipated, take 1 capfull of MiraLAX, can repeat untill having regular bowel movements.  Keep this medication out of reach of any children. You cannot drive/work while taking this medication.

## 2023-11-25 NOTE — ED Triage Notes (Signed)
 Pt comes with N/V/D and right lower belly pain. Pt states it started at midnight.

## 2023-11-25 NOTE — ED Notes (Signed)
 Called first RN to complete UA preg and send UA sample to lab.

## 2023-11-25 NOTE — ED Provider Notes (Signed)
 [facility] Provider Note    Event Date/Time   First MD Initiated Contact with Patient 11/25/23 [phone]     (approximate)   History   Abdominal Pain  HPI  Chelsea Glenn is a 23 y.o. female insignificant past medical history presents to the emergency department with abdominal pain.  Patient states that she started to not feel well yesterday while out with her family.  Was complaining of some mild nausea and just not feeling her normal self.  Overnight started having multiple episodes of vomiting with some diarrhea and some right sided abdominal pain.  Denies fever.  Vaginal delivery 2 months ago.  Initially was breast-feeding but no breast-feeding at this time.  Has an IUD in place.  No other prior abdominal surgeries.  No daily medications that are new.  No recent travel.  No family history of IBD.  Denies dysuria, urinary urgency or frequency.     Physical Exam   Triage Vital Signs: ED Triage Vitals [11/25/23 0712]  Encounter Vitals Group     BP 112/83     Girls Systolic BP Percentile      Girls Diastolic BP Percentile      Boys Systolic BP Percentile      Boys Diastolic BP Percentile      Pulse Rate (!) 110     Resp 18     Temp 98.2 F (36.8 C)     Temp src      SpO2 98 %     Weight      Height      Head Circumference      Peak Flow      Pain Score 9     Pain Loc      Pain Education      Exclude from Growth Chart     Most recent vital signs: Vitals:   11/25/23 0900 11/25/23 0930  BP: 106/66 96/61  Pulse: 87 91  Resp:  17  Temp:    SpO2: 98% 100%    Physical Exam Constitutional:      Appearance: She is well-developed.  HENT:     Head: Atraumatic.  Eyes:     Conjunctiva/sclera: Conjunctivae normal.  Cardiovascular:     Rate and Rhythm: Regular rhythm.  Pulmonary:     Effort: No respiratory distress.  Abdominal:     General: There is no distension.     Tenderness: There is abdominal tenderness in the right lower  quadrant.  Musculoskeletal:        General: Normal range of motion.     Cervical back: Normal range of motion.  Skin:    General: Skin is warm.  Neurological:     Mental Status: She is alert. Mental status is at baseline.     IMPRESSION / MDM / ASSESSMENT AND PLAN / ED COURSE  I reviewed the triage vital signs and the nursing notes.  Differential diagnosis including acute appendicitis, viral gastroenteritis, IBD, pancreatitis.  Have a lower suspicion for acute cholecystitis, no significant right upper quadrant abdominal pain or tenderness.  Low suspicion for ovarian torsion  RADIOLOGY I independently reviewed imaging, my interpretation of imaging: CT scan abdomen pelvis with contrast -no findings of acute appendicitis.  Noted migration of the IUD into the abdominal cavity  LABS (all labs ordered are listed, but only abnormal results are displayed) Labs interpreted as -    Labs Reviewed  COMPREHENSIVE METABOLIC PANEL WITH GFR - Abnormal; Notable for the following components:  Result Value   Glucose, Bld 146 (*)    Total Protein 8.3 (*)    All other components within normal limits  CBC - Abnormal; Notable for the following components:   MCV 78.3 (*)    MCH 23.6 (*)    RDW 17.0 (*)    Platelets 511 (*)    All other components within normal limits  URINALYSIS, ROUTINE W REFLEX MICROSCOPIC - Abnormal; Notable for the following components:   Color, Urine YELLOW (*)    APPearance HAZY (*)    Hgb urine dipstick LARGE (*)    Bacteria, UA RARE (*)    All other components within normal limits  LIPASE, BLOOD  POC URINE PREG, ED     MDM  Given her significant right lower quadrant abdominal tenderness to palpation will obtain lab work and plan for CT scan abdomen and pelvis with contrast.  Given IV fluids, antiemetics and IV ketorolac   Clinical Course as of 11/25/23 1020  Mon Nov 25, 2023  0934 Lab work overall reassuring with no significant leukocytosis.  Creatinine is at  baseline.  Pregnancy test is negative.  No significant electrolyte abnormalities.  Normal LFTs.  No concern for urinary tract infection.  CT scan abdomen and pelvis with contrast with no findings of acute appendicitis but did show IUD that have migrated through the myometrium.  Just recently placed with [facility].  Reaching out to gynecology for further discussion [SM]  1020 Discussed the patient's case with Dr. Starla, will need to have it surgically removed.  Patient is stable and pain is well-controlled, will follow-up tomorrow morning to discuss surgery at 10 AM.  Stated that if this did not work for the patient that she could call the office to get a different time.  Patient states that she should be able to make this appointment.  She understands that she should return to the emergency department if she has any worsening pain or fever.  Discussed symptomatic treatment with Motrin  and Tylenol .  Will give a short course of pain medication.  Given a prescription for antiemetics. [SM]    Clinical Course User Index [SM] Suzanne Kirsch, MD     PROCEDURES:  Critical Care performed: No  Procedures  Patient's presentation is most consistent with acute presentation with potential threat to life or bodily function.   MEDICATIONS ORDERED IN ED: Medications  sodium chloride  0.9 % bolus 1,000 mL (0 mLs Intravenous Stopped 11/25/23 0833)  ondansetron  (ZOFRAN ) injection 4 mg (4 mg Intravenous Given 11/25/23 0736)  ketorolac  (TORADOL ) 15 MG/ML injection 15 mg (15 mg Intravenous Given 11/25/23 0737)  iohexol (OMNIPAQUE) 300 MG/ML solution 100 mL (100 mLs Intravenous Contrast Given 11/25/23 0804)    FINAL CLINICAL IMPRESSION(S) / ED DIAGNOSES   Final diagnoses:  Right lower quadrant abdominal pain  Nausea vomiting and diarrhea  Displacement of intrauterine contraceptive device, initial encounter     Rx / DC Orders   ED Discharge Orders          Ordered    ondansetron  (ZOFRAN -ODT) 4 MG  disintegrating tablet  Every 8 hours PRN        11/25/23 1019    oxyCODONE -acetaminophen  (PERCOCET) 5-325 MG tablet  Every 4 hours PRN        11/25/23 1019             Note:  This document was prepared using Dragon voice recognition software and may include unintentional dictation errors.   Suzanne Kirsch, MD 11/25/23 1021

## 2023-11-26 ENCOUNTER — Ambulatory Visit (INDEPENDENT_AMBULATORY_CARE_PROVIDER_SITE_OTHER): Admitting: Obstetrics & Gynecology

## 2023-11-26 VITALS — BP 109/72 | HR 98 | Ht 63.0 in | Wt 156.8 lb

## 2023-11-26 DIAGNOSIS — T8332XA Displacement of intrauterine contraceptive device, initial encounter: Secondary | ICD-10-CM | POA: Diagnosis not present

## 2023-11-26 NOTE — Progress Notes (Signed)
    GYNECOLOGY PROGRESS NOTE  Subjective:    Patient ID: Chelsea Glenn, female    DOB: 11/29/2000, 23 y.o.   MRN: [id number]  HPI  Patient is a 23 y.o. engaged G2P1011 s/p NSVD on 09/15/2022. She had an IUD placed at her postpartum visit on 10/22/2023. She went to the ED on 11/25/2023 with abdominal pain, vomiting and diarrhea.  A CT was done and it showed the IUD to be in her cul de sac. Her vomiting and diarrhea are better. She has had sex twice since lUD was placed but he has used withdrawal also.   She is here today to schedule a laparoscopy to remove the IUD.  The following portions of the patient's history were reviewed and updated as appropriate: allergies, current medications, past family history, past medical history, past social history, past surgical history, and problem list.  Review of Systems Pertinent items are noted in HPI.  Pap smear normal 11/2022. She is bottle feeding and baby is doing well.  She is a [employer] trader. She used the Mirena  for 5 years in the past.   Objective:   currently breastfeeding. There is no height or weight on file to calculate BMI. Well nourished, well hydrated Latina, no apparent distress     Assessment:   Pelvic IUD  Plan:  Laparoscopy with retrieval We discussed contraception. She is not sure what she wants. I offered to do IUD while under anesthesia. She will let me know if she wants this.

## 2023-11-29 ENCOUNTER — Encounter
Admission: RE | Admit: 2023-11-29 | Discharge: 2023-11-29 | Disposition: A | Discharge status: Home / Self Care | Source: Ambulatory Visit | Attending: Obstetrics & Gynecology | Admitting: Obstetrics & Gynecology

## 2023-11-29 ENCOUNTER — Other Ambulatory Visit: Payer: Self-pay

## 2023-11-29 ENCOUNTER — Telehealth: Payer: Self-pay | Admitting: Obstetrics & Gynecology

## 2023-11-29 VITALS — Ht 63.0 in | Wt 156.0 lb

## 2023-11-29 DIAGNOSIS — Z01812 Encounter for preprocedural laboratory examination: Secondary | ICD-10-CM

## 2023-11-29 NOTE — Telephone Encounter (Signed)
 Patient is aware that surgery on 12/02/2023 needs rescheduling due to provider being unavailable to perform surgery this day. Patient has agreed to reschedule date of 12/23/2023. Patient states she is in a lot of pain which is why the surgery was placed for Monday 12/02/2023. Patient is aware that if provider update allows for an earlier procedure date than scheduled, I will reach out to accommodate a sooner date. Patient voiced understanding.

## 2023-11-29 NOTE — Patient Instructions (Addendum)
 Your procedure is scheduled on:  MONDAY OCTOBER 6  Report to the Registration Desk on the 1st floor of the [facility] To find out your arrival time, please call [phone] between 1PM - 3PM on:  FRIDAY OCTOBER 3  If your arrival time is 6:00 am, do not arrive before that time as the [facility] entrance doors do not open until 6:00 am.  REMEMBER: Instructions that are not followed completely may result in serious medical risk, up to and including death; or upon the discretion of your surgeon and anesthesiologist your surgery may need to be rescheduled.  Do not eat food after midnight the night before surgery.  No gum chewing or hard candies.   One week prior to surgery: Stop Anti-inflammatories (NSAIDS) such as Advil , Aleve, Ibuprofen , Motrin , Naproxen, Naprosyn and Aspirin based products such as Excedrin, Goody's Powder, BC Powder. Stop ANY OVER THE COUNTER supplements until after surgery. Prenatal Vit-Fe Fumarate-FA   You may however, continue to take Tylenol  if needed for pain up until the day of surgery.  Continue taking all of your other prescription medications up until the day of surgery.  ON THE DAY OF SURGERY DO NOT TAKE ANY MEDICATIONS  No Alcohol for 24 hours before or after surgery.   Do not use any recreational drugs for at least a week (preferably 2 weeks) before your surgery.  Please be advised that the combination of cocaine and anesthesia may have negative outcomes, up to and including death. If you test positive for cocaine, your surgery will be cancelled.  On the morning of surgery brush your teeth with toothpaste and water, you may rinse your mouth with mouthwash if you wish. Do not swallow any toothpaste or mouthwash.  Do not wear jewelry, make-up, hairpins, clips or nail polish.  For welded (permanent) jewelry: bracelets, anklets, waist bands, etc.  Please have this removed prior to surgery.  If it is not removed, there is a chance that hospital  personnel will need to cut it off on the day of surgery.  Do not wear lotions, powders, or perfumes.   Do not shave body hair from the neck down 48 hours before surgery.  Do not bring valuables to the hospital. [facility] is not responsible for any missing/lost belongings or valuables.   Notify your doctor if there is any change in your medical condition (cold, fever, infection).  Wear comfortable clothing (specific to your surgery type) to the hospital.  After surgery, you can help prevent lung complications by doing breathing exercises.  Take deep breaths and cough every 1-2 hours.  If you are being discharged the day of surgery, you will not be allowed to drive home. You will need a responsible individual to drive you home and stay with you for 24 hours after surgery.   If you are taking public transportation, you will need to have a responsible individual with you.  Please call the Pre-admissions Testing Dept. at [phone] if you have any questions about these instructions.  Surgery Visitation Policy:  Patients having surgery or a procedure may have two visitors.  Children under the age of 36 must have an adult with them who is not the patient.   Merchandiser, retail to address health-related social needs:  [email or web address]

## 2023-12-06 ENCOUNTER — Telehealth: Payer: Self-pay

## 2023-12-06 ENCOUNTER — Other Ambulatory Visit: Payer: Self-pay | Admitting: Registered Nurse

## 2023-12-06 MED ORDER — OXYCODONE HCL 5 MG PO CAPS: 5.0000 mg | ORAL_CAPSULE | ORAL | 0 refills | Status: DC | PRN

## 2023-12-06 MED ORDER — CYCLOBENZAPRINE HCL 5 MG PO TABS: 5.0000 mg | ORAL_TABLET | Freq: Three times a day (TID) | ORAL | 0 refills | Status: DC | PRN

## 2023-12-06 NOTE — Telephone Encounter (Signed)
 Patient calling in to request additional pain medication. Reports she is in pain due to her IUD and will not have pain medication that lasts her until 10/27 now that her IUD removal in the OR has been postponed. She was prescribed Oxycodone  on 9/29 in the ED and wants to know if she can have a refill. Please advise.

## 2023-12-20 ENCOUNTER — Telehealth: Payer: Self-pay | Admitting: Obstetrics & Gynecology

## 2023-12-20 NOTE — Telephone Encounter (Signed)
 Patient called and wanted to reschedule her surgery on 12/23/2023 due to transportation and child care. Patient agreed to new surgery date of 01/13/2024.

## 2024-01-01 ENCOUNTER — Other Ambulatory Visit: Payer: Self-pay

## 2024-01-01 ENCOUNTER — Encounter
Admission: RE | Admit: 2024-01-01 | Discharge: 2024-01-01 | Disposition: A | Discharge status: Home / Self Care | Source: Ambulatory Visit | Attending: Obstetrics & Gynecology | Admitting: Obstetrics & Gynecology

## 2024-01-01 NOTE — Patient Instructions (Signed)
 Your procedure is scheduled on:01-13-24 Monday Report to the Registration Desk on the 1st floor of the [facility].Then proceed to the 2nd floor Surgery Desk To find out your arrival time, please call [phone] between 1PM - 3PM on:01-10-24 Friday If your arrival time is 6:00 am, do not arrive before that time as the [facility] entrance doors do not open until 6:00 am.  REMEMBER: Instructions that are not followed completely may result in serious medical risk, up to and including death; or upon the discretion of your surgeon and anesthesiologist your surgery may need to be rescheduled.  Do not eat food after midnight the night before surgery.  No gum chewing or hard candies.  You may however, drink CLEAR liquids up to 2 hours before you are scheduled to arrive for your surgery. Do not drink anything within 2 hours of your scheduled arrival time.  Clear liquids include: - water  - apple juice without pulp - gatorade (not RED colors) - black coffee or tea (Do NOT add milk or creamers to the coffee or tea) Do NOT drink anything that is not on this list.  One week prior to surgery:Last dose will be on 01-05-24 Sunday Stop Anti-inflammatories (NSAIDS) such as Advil , Aleve, Ibuprofen , Motrin , Naproxen, Naprosyn and Aspirin based products such as Excedrin, Goody's Powder, BC Powder. Stop ANY OVER THE COUNTER supplements until after surgery (Multivitamin)  You may however, continue to take Tylenol  if needed for pain up until the day of surgery.  Continue taking all of your other prescription medications up until the day of surgery.  Do NOT take any medication the day of surgery  No Alcohol for 24 hours before or after surgery.  No Smoking including e-cigarettes for 24 hours before surgery.  No chewable tobacco products for at least 6 hours before surgery.  No nicotine patches on the day of surgery.  Do not use any recreational drugs for at least a week (preferably 2 weeks) before  your surgery.  Please be advised that the combination of cocaine and anesthesia may have negative outcomes, up to and including death. If you test positive for cocaine, your surgery will be cancelled.  On the morning of surgery brush your teeth with toothpaste and water, you may rinse your mouth with mouthwash if you wish. Do not swallow any toothpaste or mouthwash.  Use CHG Soap as directed on instruction sheet.  Do not wear jewelry, make-up, hairpins, clips or nail polish.  For welded (permanent) jewelry: bracelets, anklets, waist bands, etc.  Please have this removed prior to surgery.  If it is not removed, there is a chance that hospital personnel will need to cut it off on the day of surgery.  Do not wear lotions, powders, or perfumes.   Do not shave body hair from the neck down 48 hours before surgery.  Contact lenses, hearing aids and dentures may not be worn into surgery.  Do not bring valuables to the hospital. [facility] is not responsible for any missing/lost belongings or valuables.   Notify your doctor if there is any change in your medical condition (cold, fever, infection).  Wear comfortable clothing (specific to your surgery type) to the hospital.  After surgery, you can help prevent lung complications by doing breathing exercises.  Take deep breaths and cough every 1-2 hours. Your doctor may order a device called an Incentive Spirometer to help you take deep breaths. When coughing or sneezing, hold a pillow firmly against your incision with both  hands. This is called "splinting." Doing this helps protect your incision. It also decreases belly discomfort.  If you are being admitted to the hospital overnight, leave your suitcase in the car. After surgery it may be brought to your room.  In case of increased patient census, it may be necessary for you, the patient, to continue your postoperative care in the Same Day Surgery department.  If you are being discharged the  day of surgery, you will not be allowed to drive home. You will need a responsible individual to drive you home and stay with you for 24 hours after surgery.   If you are taking public transportation, you will need to have a responsible individual with you.  Please call the Pre-admissions Testing Dept. at [phone] if you have any questions about these instructions.  Surgery Visitation Policy:  Patients having surgery or a procedure may have two visitors.  Children under the age of 50 must have an adult with them who is not the patient.                                                                                                             Preparing for Surgery with CHLORHEXIDINE GLUCONATE (CHG) Soap  Chlorhexidine Gluconate (CHG) Soap  o An antiseptic cleaner that kills germs and bonds with the skin to continue killing germs even after washing  o Used for showering the night before surgery and morning of surgery  Before surgery, you can play an important role by reducing the number of germs on your skin.  CHG (Chlorhexidine gluconate) soap is an antiseptic cleanser which kills germs and bonds with the skin to continue killing germs even after washing.  Please do not use if you have an allergy to CHG or antibacterial soaps. If your skin becomes reddened/irritated stop using the CHG.  1. Shower the NIGHT BEFORE SURGERY with CHG soap.  2. If you choose to wash your hair, wash your hair first as usual with your normal shampoo.  3. After shampooing, rinse your hair and body thoroughly to remove the shampoo.  4. Use CHG as you would any other liquid soap. You can apply CHG directly to the skin and wash gently with a clean washcloth.  5. Apply the CHG soap to your body only from the neck down. Do not use on open wounds or open sores. Avoid contact with your eyes, ears, mouth, and genitals (private parts). Wash face and genitals (private parts) with your normal soap.  6. Wash  thoroughly, paying special attention to the area where your surgery will be performed.  7. Thoroughly rinse your body with warm water.  8. Do not shower/wash with your normal soap after using and rinsing off the CHG soap.  9. Do not use lotions, oils, etc., after showering with CHG.  10. Pat yourself dry with a clean towel.  11. Wear clean pajamas to bed the night before surgery.  12. Place clean sheets on your bed the night of your shower and do not sleep with  pets.  13. Do not apply any deodorants/lotions/powders.  14. Please wear clean clothes to the hospital.  15. Remember to brush your teeth with your regular toothpaste.  Merchandiser, Retail to address health-related social needs:  [email or web address]

## 2024-01-07 ENCOUNTER — Encounter: Admitting: Obstetrics & Gynecology

## 2024-01-13 ENCOUNTER — Ambulatory Visit
Admission: RE | Admit: 2024-01-13 | Discharge: 2024-01-13 | Disposition: A | Discharge status: Home / Self Care | Attending: Obstetrics & Gynecology | Admitting: Obstetrics & Gynecology

## 2024-01-13 ENCOUNTER — Encounter: Payer: Self-pay | Admitting: Obstetrics & Gynecology

## 2024-01-13 ENCOUNTER — Encounter
Admission: RE | Disposition: A | Discharge status: Home / Self Care | Payer: Self-pay | Source: Home / Self Care | Attending: Obstetrics & Gynecology

## 2024-01-13 ENCOUNTER — Ambulatory Visit: Payer: Self-pay | Admitting: Urgent Care

## 2024-01-13 ENCOUNTER — Other Ambulatory Visit: Payer: Self-pay

## 2024-01-13 ENCOUNTER — Ambulatory Visit

## 2024-01-13 DIAGNOSIS — Z30432 Encounter for removal of intrauterine contraceptive device: Secondary | ICD-10-CM | POA: Diagnosis not present

## 2024-01-13 DIAGNOSIS — T8332XD Displacement of intrauterine contraceptive device, subsequent encounter: Secondary | ICD-10-CM

## 2024-01-13 DIAGNOSIS — Y831 Surgical operation with implant of artificial internal device as the cause of abnormal reaction of the patient, or of later complication, without mention of misadventure at the time of the procedure: Secondary | ICD-10-CM | POA: Insufficient documentation

## 2024-01-13 DIAGNOSIS — Z01812 Encounter for preprocedural laboratory examination: Secondary | ICD-10-CM

## 2024-01-13 DIAGNOSIS — Z87891 Personal history of nicotine dependence: Secondary | ICD-10-CM | POA: Diagnosis not present

## 2024-01-13 DIAGNOSIS — T8332XA Displacement of intrauterine contraceptive device, initial encounter: Secondary | ICD-10-CM | POA: Diagnosis present

## 2024-01-13 DIAGNOSIS — T8332XS Displacement of intrauterine contraceptive device, sequela: Secondary | ICD-10-CM

## 2024-01-13 DIAGNOSIS — D649 Anemia, unspecified: Secondary | ICD-10-CM | POA: Diagnosis not present

## 2024-01-13 HISTORY — PX: LAPAROSCOPY: SHX197

## 2024-01-13 HISTORY — PX: IUD REMOVAL: SHX5392

## 2024-01-13 LAB — CBC
HCT: 34.3 % — ABNORMAL LOW (ref 36.0–46.0)
Hemoglobin: 10.5 g/dL — ABNORMAL LOW (ref 12.0–15.0)
MCH: 23.2 pg — ABNORMAL LOW (ref 26.0–34.0)
MCHC: 30.6 g/dL (ref 30.0–36.0)
MCV: 75.7 fL — ABNORMAL LOW (ref 80.0–100.0)
Platelets: 498 K/uL — ABNORMAL HIGH (ref 150–400)
RBC: 4.53 MIL/uL (ref 3.87–5.11)
RDW: 15.8 % — ABNORMAL HIGH (ref 11.5–15.5)
WBC: 8.5 K/uL (ref 4.0–10.5)
nRBC: 0 % (ref 0.0–0.2)

## 2024-01-13 LAB — TYPE AND SCREEN
ABO/RH(D): O POS
Antibody Screen: NEGATIVE

## 2024-01-13 LAB — POCT PREGNANCY, URINE: Preg Test, Ur: NEGATIVE

## 2024-01-13 SURGERY — LAPAROSCOPY, DIAGNOSTIC
Anesthesia: General

## 2024-01-13 MED ORDER — ACETAMINOPHEN 10 MG/ML IV SOLN
INTRAVENOUS | Status: DC | PRN
  Administered 2024-01-13: 1000 mg via INTRAVENOUS

## 2024-01-13 MED ORDER — FENTANYL CITRATE (PF) 100 MCG/2ML IJ SOLN
25.0000 ug | INTRAMUSCULAR | Status: DC | PRN
  Administered 2024-01-13: 25 ug via INTRAVENOUS

## 2024-01-13 MED ORDER — LIDOCAINE HCL (PF) 2 % IJ SOLN
INTRAMUSCULAR | Status: AC
  Filled 2024-01-13: qty 5

## 2024-01-13 MED ORDER — IBUPROFEN 600 MG PO TABS
600.0000 mg | ORAL_TABLET | Freq: Four times a day (QID) | ORAL | 1 refills | Status: AC | PRN
Start: 2024-01-13 — End: ?

## 2024-01-13 MED ORDER — LACTATED RINGERS IV SOLN: INTRAVENOUS | Status: DC

## 2024-01-13 MED ORDER — BUPIVACAINE HCL (PF) 0.5 % IJ SOLN
INTRAMUSCULAR | Status: AC
Start: 2024-01-13 — End: 2024-01-13
  Filled 2024-01-13: qty 30

## 2024-01-13 MED ORDER — PROPOFOL 10 MG/ML IV BOLUS
INTRAVENOUS | Status: DC | PRN
  Administered 2024-01-13: 200 mg via INTRAVENOUS

## 2024-01-13 MED ORDER — MIDAZOLAM HCL 2 MG/2ML IJ SOLN
INTRAMUSCULAR | Status: AC
  Filled 2024-01-13: qty 2

## 2024-01-13 MED ORDER — CHLORHEXIDINE GLUCONATE 0.12 % MT SOLN
15.0000 mL | Freq: Once | OROMUCOSAL | Status: AC
  Administered 2024-01-13: 15 mL via OROMUCOSAL

## 2024-01-13 MED ORDER — DEXTROSE IN LACTATED RINGERS 5 % IV SOLN: INTRAVENOUS | Status: DC

## 2024-01-13 MED ORDER — KETOROLAC TROMETHAMINE 30 MG/ML IJ SOLN
INTRAMUSCULAR | Status: AC
  Filled 2024-01-13: qty 1

## 2024-01-13 MED ORDER — MIDAZOLAM HCL (PF) 2 MG/2ML IJ SOLN
INTRAMUSCULAR | Status: DC | PRN
  Administered 2024-01-13: 2 mg via INTRAVENOUS

## 2024-01-13 MED ORDER — SUGAMMADEX SODIUM 200 MG/2ML IV SOLN
INTRAVENOUS | Status: DC | PRN
  Administered 2024-01-13: 400 mg via INTRAVENOUS

## 2024-01-13 MED ORDER — DEXMEDETOMIDINE HCL IN NACL 80 MCG/20ML IV SOLN
INTRAVENOUS | Status: DC | PRN
  Administered 2024-01-13: 8 ug via INTRAVENOUS

## 2024-01-13 MED ORDER — CHLORHEXIDINE GLUCONATE 0.12 % MT SOLN
OROMUCOSAL | Status: AC
  Filled 2024-01-13: qty 15

## 2024-01-13 MED ORDER — ORAL CARE MOUTH RINSE: 15.0000 mL | Freq: Once | OROMUCOSAL | Status: AC

## 2024-01-13 MED ORDER — OXYCODONE HCL 5 MG PO TABS
5.0000 mg | ORAL_TABLET | Freq: Once | ORAL | Status: AC | PRN
  Administered 2024-01-13: 5 mg via ORAL

## 2024-01-13 MED ORDER — PROPOFOL 1000 MG/100ML IV EMUL
INTRAVENOUS | Status: AC
  Filled 2024-01-13: qty 100

## 2024-01-13 MED ORDER — ACETAMINOPHEN 325 MG PO TABS: 650.0000 mg | ORAL_TABLET | ORAL | 2 refills | Status: AC | PRN

## 2024-01-13 MED ORDER — OXYCODONE HCL 5 MG PO TABS
ORAL_TABLET | ORAL | Status: AC
  Filled 2024-01-13: qty 1

## 2024-01-13 MED ORDER — ONDANSETRON HCL 4 MG/2ML IJ SOLN
INTRAMUSCULAR | Status: DC | PRN
Start: 2024-01-13 — End: 2024-01-13
  Administered 2024-01-13: 4 mg via INTRAVENOUS

## 2024-01-13 MED ORDER — ONDANSETRON HCL 4 MG/2ML IJ SOLN
INTRAMUSCULAR | Status: AC
  Filled 2024-01-13: qty 2

## 2024-01-13 MED ORDER — PROPOFOL 500 MG/50ML IV EMUL
INTRAVENOUS | Status: DC | PRN
  Administered 2024-01-13: 150 ug/kg/min via INTRAVENOUS

## 2024-01-13 MED ORDER — LIDOCAINE HCL (CARDIAC) PF 100 MG/5ML IV SOSY
PREFILLED_SYRINGE | INTRAVENOUS | Status: DC | PRN
  Administered 2024-01-13: 60 mg via INTRAVENOUS

## 2024-01-13 MED ORDER — OXYCODONE HCL 5 MG/5ML PO SOLN: 5.0000 mg | Freq: Once | ORAL | Status: AC | PRN

## 2024-01-13 MED ORDER — PROPOFOL 10 MG/ML IV BOLUS
INTRAVENOUS | Status: AC
  Filled 2024-01-13: qty 20

## 2024-01-13 MED ORDER — FENTANYL CITRATE (PF) 100 MCG/2ML IJ SOLN
INTRAMUSCULAR | Status: AC
  Filled 2024-01-13: qty 2

## 2024-01-13 MED ORDER — GLYCOPYRROLATE 0.2 MG/ML IJ SOLN
INTRAMUSCULAR | Status: DC | PRN
  Administered 2024-01-13: .1 mg via INTRAVENOUS

## 2024-01-13 MED ORDER — POVIDONE-IODINE 10 % EX SWAB
2.0000 | Freq: Once | CUTANEOUS | Status: AC
  Administered 2024-01-13: 2 via TOPICAL

## 2024-01-13 MED ORDER — 0.9 % SODIUM CHLORIDE (POUR BTL) OPTIME
TOPICAL | Status: DC | PRN
  Administered 2024-01-13: 500 mL

## 2024-01-13 MED ORDER — BUPIVACAINE HCL (PF) 0.5 % IJ SOLN
INTRAMUSCULAR | Status: DC | PRN
Start: 2024-01-13 — End: 2024-01-13
  Administered 2024-01-13: 30 mL

## 2024-01-13 MED ORDER — KETOROLAC TROMETHAMINE 30 MG/ML IJ SOLN
INTRAMUSCULAR | Status: DC | PRN
  Administered 2024-01-13: 30 mg via INTRAVENOUS

## 2024-01-13 MED ORDER — FENTANYL CITRATE (PF) 100 MCG/2ML IJ SOLN
INTRAMUSCULAR | Status: DC | PRN
  Administered 2024-01-13 (×2): 50 ug via INTRAVENOUS

## 2024-01-13 MED ORDER — ACETAMINOPHEN 10 MG/ML IV SOLN
INTRAVENOUS | Status: AC
  Filled 2024-01-13: qty 100

## 2024-01-13 MED ORDER — DEXAMETHASONE SOD PHOSPHATE PF 10 MG/ML IJ SOLN
INTRAMUSCULAR | Status: DC | PRN
  Administered 2024-01-13: 10 mg via INTRAVENOUS

## 2024-01-13 MED ORDER — ROCURONIUM BROMIDE 10 MG/ML (PF) SYRINGE
PREFILLED_SYRINGE | INTRAVENOUS | Status: AC
  Filled 2024-01-13: qty 10

## 2024-01-13 MED ORDER — GLYCOPYRROLATE 0.2 MG/ML IJ SOLN
INTRAMUSCULAR | Status: AC
  Filled 2024-01-13: qty 1

## 2024-01-13 MED ORDER — OXYCODONE-ACETAMINOPHEN 5-325 MG PO TABS: 1.0000 | ORAL_TABLET | ORAL | 0 refills | Status: DC | PRN

## 2024-01-13 MED ORDER — ROCURONIUM BROMIDE 100 MG/10ML IV SOLN
INTRAVENOUS | Status: DC | PRN
Start: 2024-01-13 — End: 2024-01-13
  Administered 2024-01-13: 50 mg via INTRAVENOUS
  Administered 2024-01-13: 20 mg via INTRAVENOUS
  Administered 2024-01-13: 10 mg via INTRAVENOUS

## 2024-01-13 SURGICAL SUPPLY — 44 items
ANCHOR TIS RET SYS 235ML (MISCELLANEOUS) IMPLANT
BAG URINE DRAIN 2000ML AR STRL (UROLOGICAL SUPPLIES) IMPLANT
CATH FOLEY SIL 2WAY 14FR5CC (CATHETERS) IMPLANT
DEFOGGER SCOPE WARM SEASHARP (MISCELLANEOUS) IMPLANT
DRSG OPSITE POSTOP 3X4 (GAUZE/BANDAGES/DRESSINGS) IMPLANT
DURAPREP 6ML APPLICATOR 50/CS (WOUND CARE) ×2 IMPLANT
ELECTRODE REM PT RTRN 9FT ADLT (ELECTROSURGICAL) IMPLANT
ENDOLOOP SUT PDS II 0 18 (SUTURE) IMPLANT
GAUZE 4X4 16PLY ~~LOC~~+RFID DBL (SPONGE) ×1 IMPLANT
GLOVE BIO SURGEON STRL SZ 6.5 (GLOVE) ×2 IMPLANT
GLOVE BIOGEL M 6.5 STRL (GLOVE) ×1 IMPLANT
GLOVE BIOGEL PI IND STRL 7.0 (GLOVE) IMPLANT
GOWN STRL REUS W/ TWL LRG LVL3 (GOWN DISPOSABLE) ×2 IMPLANT
HOLDER FOLEY CATH W/STRAP (MISCELLANEOUS) IMPLANT
IRRIGATION STRYKERFLOW (MISCELLANEOUS) ×1 IMPLANT
KIT TURNOVER CYSTO (KITS) ×1 IMPLANT
LHOOK LAP DISP 36CM (ELECTROSURGICAL) IMPLANT
MANIFOLD NEPTUNE II (INSTRUMENTS) ×1 IMPLANT
NDL HYPO 22X1.5 SAFETY MO (MISCELLANEOUS) ×1 IMPLANT
NDL INSUFFLATION 14GA 120MM (NEEDLE) ×1 IMPLANT
NEEDLE HYPO 22X1.5 SAFETY MO (MISCELLANEOUS) ×1 IMPLANT
NEEDLE INSUFFLATION 14GA 120MM (NEEDLE) ×1 IMPLANT
NS IRRIG 500ML POUR BTL (IV SOLUTION) ×1 IMPLANT
PACK DNC HYST (MISCELLANEOUS) ×1 IMPLANT
PACK GYN LAPAROSCOPIC (MISCELLANEOUS) ×1 IMPLANT
PAD PREP OB/GYN DISP 24X41 (PERSONAL CARE ITEMS) ×1 IMPLANT
PENCIL SMOKE EVACUATOR (MISCELLANEOUS) IMPLANT
SCRUB CHG 4% DYNA-HEX 4OZ (MISCELLANEOUS) ×1 IMPLANT
SET TUBE SMOKE EVAC HIGH FLOW (TUBING) ×1 IMPLANT
SHEARS HARMONIC 36 ACE (MISCELLANEOUS) IMPLANT
SLEEVE Z-THREAD 5X100MM (TROCAR) ×1 IMPLANT
SOLUTION PREP PVP 2OZ (MISCELLANEOUS) ×1 IMPLANT
STRIP CLOSURE SKIN 1/2X4 (GAUZE/BANDAGES/DRESSINGS) IMPLANT
STRIP CLOSURE SKIN 1/4X4 (GAUZE/BANDAGES/DRESSINGS) ×1 IMPLANT
SUT VIC AB 4-0 PS2 27 (SUTURE) ×2 IMPLANT
SUT VICRYL 0 UR6 27IN ABS (SUTURE) ×2 IMPLANT
SYR 10ML LL (SYRINGE) IMPLANT
SYR CONTROL 10ML LL (SYRINGE) ×1 IMPLANT
TOWEL OR 17X26 4PK STRL BLUE (TOWEL DISPOSABLE) ×2 IMPLANT
TRAP FLUID SMOKE EVACUATOR (MISCELLANEOUS) ×1 IMPLANT
TROCAR BALLN 12MMX100 BLUNT (TROCAR) IMPLANT
TROCAR Z-THRD FIOS HNDL 11X100 (TROCAR) IMPLANT
TROCAR Z-THREAD FIOS 5X100MM (TROCAR) ×1 IMPLANT
WATER STERILE IRR 500ML POUR (IV SOLUTION) ×1 IMPLANT

## 2024-01-13 NOTE — Transfer of Care (Signed)
 Immediate Anesthesia Transfer of Care Note  Patient: Chelsea Glenn  Procedure(s) Performed: LAPAROSCOPY, DIAGNOSTIC REMOVAL, INTRAUTERINE DEVICE  Patient Location: PACU  Anesthesia Type:General  Level of Consciousness: drowsy  Airway & Oxygen Therapy: Patient Spontanous Breathing and Patient connected to face mask oxygen  Post-op Assessment: Report given to RN and Post -op Vital signs reviewed and stable  Post vital signs: Reviewed and stable  Last Vitals:  Vitals Value Taken Time  BP 101/70 01/13/24 10:30  Temp    Pulse 50 01/13/24 10:30  Resp 20 01/13/24 10:30  SpO2 100 % 01/13/24 10:30  Vitals shown include unfiled device data.  Last Pain:  Vitals:   01/13/24 0745  TempSrc: Temporal  PainSc: 0-No pain         Complications: No notable events documented.

## 2024-01-13 NOTE — Anesthesia Preprocedure Evaluation (Signed)
 Anesthesia Evaluation    Airway Mallampati: II  TM Distance: >3 FB     Dental no notable dental hx.    Pulmonary former smoker   breath sounds clear to auscultation       Cardiovascular  Rhythm:Regular     Neuro/Psych  Headaches    GI/Hepatic   Endo/Other  diabetes    Renal/GU      Musculoskeletal   Abdominal   Peds  Hematology  (+) Blood dyscrasia, anemia   Anesthesia Other Findings   Reproductive/Obstetrics                              Anesthesia Physical Anesthesia Plan  ASA: 2  Anesthesia Plan: General   Post-op Pain Management:    Induction:   PONV Risk Score and Plan:   Airway Management Planned: Oral ETT  Additional Equipment:   Intra-op Plan:   Post-operative Plan: Extubation in OR  Informed Consent: I have reviewed the patients History and Physical, chart, labs and discussed the procedure including the risks, benefits and alternatives for the proposed anesthesia with the patient or authorized representative who has indicated his/her understanding and acceptance.       Plan Discussed with:   Anesthesia Plan Comments:         Anesthesia Quick Evaluation
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
## 2024-01-13 NOTE — OR Nursing (Signed)
 IUD discarded per Dr. Starla.

## 2024-01-13 NOTE — Op Note (Addendum)
 01/13/2024  10:39 AM  PATIENT:  Chelsea Glenn  23 y.o. female  PRE-OPERATIVE DIAGNOSIS:  pelvic IUD  POST-OPERATIVE DIAGNOSIS:  pelvic IUD  PROCEDURE:  Procedure(s) with comments: LAPAROSCOPY, DIAGNOSTIC (N/A) - with retrieval of IUD REMOVAL, INTRAUTERINE DEVICE (N/A)  SURGEON:  Surgeons and Role:    * Marcellas Marchant, Harland BROCKS, MD - Primary    * Leigh Sober, MD - Assisting  ANESTHESIA:   local and general  EBL:  2 mL   BLOOD ADMINISTERED:none  DRAINS: none   LOCAL MEDICATIONS USED:  MARCAINE      SPECIMEN:  No Specimen  DISPOSITION OF SPECIMEN:  N/A  COUNTS:  YES  TOURNIQUET:  * No tourniquets in log *  DICTATION: .Note written in EPIC  PLAN OF CARE: Discharge to home after PACU  PATIENT DISPOSITION:  PACU - hemodynamically stable.   Delay start of Pharmacological VTE agent (>24hrs) due to surgical blood loss or risk of bleeding: not applicable  The risks, benefits, and alternatives of surgery were explained, understood, accepted.  In the operating room she was placed in the dorsal lithotomy position, and general anesthesia was given without complication. Her abdomen and vagina were prepped and draped in the usual sterile fashion. A timeout procedure was done. A bimanual exam revealed a normal size and shape anteverted and mobile uterus. Her adnexa felt normal. A Hulka manipulator was placed. Her bladder was emptied with a Robinson catheter. Gloves were changed, and attention was turned to the abdomen. Approximately the 10 mL of 0.5% Marcaine  was injected into the umbilicus. A vertical incision was made at the site. The abdomen was elevated with towel clamps.  A Veress needle was placed. Low-flow CO2 was was started but patient abdominal pressure was never less than 12 so I stopped this procedure and proceed with a cut down entry. I grasped the fascia with 2 Kocher clamped and elevated it. I incised it and entered the peritoneum with hemostats. The fascia was tagged and held  with 0 vicryl suture. I then placed an 11 mm Hassan trocar. Laparoscopy confirmed correct placement.  Once a good pneumoperitoneum was established, a 5 mm Excel trocar was placed in each lower quadrant under direct laparoscopic visualization after injecting 0.5% Marcaine  in the incision sites. Her pelvis and upper abdomen appeared normal. I noted the Mirena  IUD in the cul de sac encased with adhesions along with the fimbria of the right oviduct. A Harmonic scapel was used to delicately remove the adhesions from the IUD. To assist in the process, tension was placed on the IUD strings. I removed the intact IUD an photographed it.  The CO2 was allowed to escape from the abdomen. I closed the fascia at the umbilicus with the held vicryl sutures. A subcuticular closure was done at all incision sites. The Hulka manipulator was removed.  A Steri-Strip was placed across each incision. She was extubated and taken to the recovery room in stable condition.   An experienced assistant was required given the standard of surgical care given the complexity of the case. This assistant was needed for exposure, dissection, suctioning, retraction, instrument exchange, assisting with delivery with administration of fundal pressure, and for overall help during the procedure.

## 2024-01-13 NOTE — Anesthesia Procedure Notes (Signed)
 Procedure Name: Intubation Date/Time: 01/13/2024 9:25 AM  Performed by: Suman Trivedi, CRNAPre-anesthesia Checklist: Patient identified, Emergency Drugs available, Suction available and Patient being monitored Patient Re-evaluated:Patient Re-evaluated prior to induction Oxygen Delivery Method: Circle System Utilized Preoxygenation: Pre-oxygenation with 100% oxygen Induction Type: IV induction Ventilation: Mask ventilation without difficulty Laryngoscope Size: McGrath and 3 Grade View: Grade I Tube type: Oral Tube size: 7.0 mm Number of attempts: 1 Airway Equipment and Method: Stylet and Oral airway Placement Confirmation: ETT inserted through vocal cords under direct vision, positive ETCO2 and breath sounds checked- equal and bilateral Secured at: 21 cm Tube secured with: Tape Dental Injury: Teeth and Oropharynx as per pre-operative assessment  Comments: Lips, teeth, and tongue unchanged. Head and neck midline.

## 2024-01-13 NOTE — Anesthesia Postprocedure Evaluation (Signed)
 Anesthesia Post Note  Patient: Chelsea Glenn  Procedure(s) Performed: LAPAROSCOPY, DIAGNOSTIC REMOVAL, INTRAUTERINE DEVICE  Patient location during evaluation: PACU Anesthesia Type: General Level of consciousness: awake and alert Pain management: pain level controlled Vital Signs Assessment: post-procedure vital signs reviewed and stable Respiratory status: spontaneous breathing, nonlabored ventilation, respiratory function stable and patient connected to nasal cannula oxygen Cardiovascular status: blood pressure returned to baseline and stable Postop Assessment: no apparent nausea or vomiting Anesthetic complications: no   No notable events documented.   Last Vitals:  Vitals:   01/13/24 1100 01/13/24 1115  BP: 96/66 (!) 93/54  Pulse: (!) 43 (!) 47  Resp: 14 12  Temp:    SpO2: 100% 99%    Last Pain:  Vitals:   01/13/24 1115  TempSrc:   PainSc: 5                  Shau-Shau Melia

## 2024-01-14 ENCOUNTER — Encounter: Payer: Self-pay | Admitting: Obstetrics & Gynecology

## 2024-01-21 ENCOUNTER — Encounter: Admitting: Obstetrics & Gynecology

## 2024-02-04 ENCOUNTER — Telehealth: Payer: Self-pay

## 2024-02-04 NOTE — Telephone Encounter (Signed)
 Patient states she had IUD surgically removed on 01/13/24. She states for the past week she is having the same pain as when she had the IUD. She had minimal relief with Ibuprofen .

## 2024-02-06 NOTE — Telephone Encounter (Signed)
 Patient aware. Transferred to front desk fro scheduling.

## 2024-02-10 ENCOUNTER — Encounter: Payer: Self-pay | Admitting: Obstetrics & Gynecology

## 2024-02-10 ENCOUNTER — Ambulatory Visit (INDEPENDENT_AMBULATORY_CARE_PROVIDER_SITE_OTHER): Admitting: Obstetrics & Gynecology

## 2024-02-10 VITALS — BP 114/77 | HR 101 | Ht 63.0 in | Wt 156.0 lb

## 2024-02-10 DIAGNOSIS — R102 Pelvic and perineal pain unspecified side: Secondary | ICD-10-CM

## 2024-02-10 DIAGNOSIS — N926 Irregular menstruation, unspecified: Secondary | ICD-10-CM

## 2024-02-10 DIAGNOSIS — Z9889 Other specified postprocedural states: Secondary | ICD-10-CM

## 2024-02-10 LAB — POCT URINE PREGNANCY: Preg Test, Ur: NEGATIVE

## 2024-02-10 NOTE — Addendum Note (Signed)
 Addended by: Lihanna Biever on: 02/10/2024 02:55 PM   Modules accepted: Orders

## 2024-02-10 NOTE — Progress Notes (Signed)
° ° °  GYNECOLOGY PROGRESS NOTE  Subjective:    Patient ID: Chelsea Glenn, female    DOB: 01-19-2001, 23 y.o.   MRN: [id number]  HPI  Patient is a 23 y.o. G2P1011 here for a post op visit. She had a laparoscopy on 01/13/2024 to remove a Mirena  IUD that had been found in her pelvis soon after it was placed. She was having pelvic pain that led to the discovery of the pelvic/cul de sac IUD via a CT scan done in the ER 11/25/2023.  Her surgery was uncomplicated. Since her surgery, she reports that she still has the same pelvic pain that led her to the ER in Sept. She has taken IBU 600 mg with does help with the pain.  She also reports that she has not had a period since surgery. She had a negative UPT on the day of her surgery. She reports rare sex and uses condoms faithfully.  The following portions of the patient's history were reviewed and updated as appropriate: allergies, current medications, past family history, past medical history, past social history, past surgical history, and problem list.  Review of Systems Pertinent items are noted in HPI.   Objective:   Blood pressure 114/77, pulse (!) 101, height 5' 3 (1.6 m), weight 156 lb (70.8 kg), last menstrual period 01/05/2024, not currently breastfeeding. Body mass index is 27.63 kg/m. Well nourished, well hydrated Latina, no apparent distress  Abd- benign Incisions- healed well Bimanual exam reveals a small retroverted uterus, non-tender with no adnexal masses and no point tenderness  Assessment:   1. Post-operative state   2. Pelvic pain   3. Missed period      Plan:   1. Post-operative state (Primary) - doing well  2. Pelvic pain - Pelvic ultrasound ordered  3. Missed period - UPT negative - rec watchful waiting

## 2024-02-25 ENCOUNTER — Encounter: Admitting: Obstetrics & Gynecology

## 2024-03-04 ENCOUNTER — Other Ambulatory Visit

## 2024-03-04 ENCOUNTER — Telehealth: Payer: Self-pay | Admitting: Obstetrics & Gynecology

## 2024-03-04 NOTE — Telephone Encounter (Signed)
 Reached out to pt about GYN US  that was scheduled on 03/04/2024 at 10:15 per Dr. Starla.  Left message for pt to call back about scheduling.

## 2024-03-05 NOTE — Telephone Encounter (Signed)
 Reached out to pt (2x) about GYN US  that was scheduled on 03/04/2024 at 10:15 per Dr. Starla.  Was able to reschedule the appt to 03/19/2024 at 10:15.

## 2024-03-19 ENCOUNTER — Ambulatory Visit

## 2024-03-19 DIAGNOSIS — R102 Pelvic and perineal pain unspecified side: Secondary | ICD-10-CM | POA: Diagnosis not present

## 2024-03-24 ENCOUNTER — Ambulatory Visit: Payer: Self-pay | Admitting: Obstetrics & Gynecology

## 2024-04-02 ENCOUNTER — Ambulatory Visit: Admitting: Registered Nurse

## 2024-04-02 DIAGNOSIS — Z3009 Encounter for other general counseling and advice on contraception: Secondary | ICD-10-CM
